# Patient Record
Sex: Female | Born: 1980
Health system: Southern US, Community
[De-identification: ages and names within clinical notes are randomized; demographics above are authoritative.]

## PROBLEM LIST (undated history)

## (undated) DIAGNOSIS — G8929 Other chronic pain: Secondary | ICD-10-CM

## (undated) DIAGNOSIS — F32A Depression, unspecified: Secondary | ICD-10-CM

## (undated) DIAGNOSIS — F419 Anxiety disorder, unspecified: Secondary | ICD-10-CM

## (undated) DIAGNOSIS — T7840XA Allergy, unspecified, initial encounter: Secondary | ICD-10-CM

## (undated) DIAGNOSIS — E7800 Pure hypercholesterolemia, unspecified: Secondary | ICD-10-CM

## (undated) HISTORY — DX: Anxiety disorder, unspecified: F41.9

## (undated) HISTORY — DX: Allergy, unspecified, initial encounter: T78.40XA

## (undated) HISTORY — DX: Pure hypercholesterolemia, unspecified: E78.00

## (undated) HISTORY — DX: Depression, unspecified: F32.A

## (undated) HISTORY — DX: Other chronic pain: G89.29

---

## 2019-08-21 ENCOUNTER — Encounter: Payer: Self-pay | Admitting: Psychiatry

## 2019-08-21 ENCOUNTER — Ambulatory Visit (INDEPENDENT_AMBULATORY_CARE_PROVIDER_SITE_OTHER): Payer: Medicaid Other | Admitting: Psychiatry

## 2019-08-21 ENCOUNTER — Other Ambulatory Visit: Payer: Self-pay

## 2019-08-21 DIAGNOSIS — F3341 Major depressive disorder, recurrent, in partial remission: Secondary | ICD-10-CM | POA: Insufficient documentation

## 2019-08-21 DIAGNOSIS — F411 Generalized anxiety disorder: Secondary | ICD-10-CM | POA: Diagnosis not present

## 2019-08-21 DIAGNOSIS — F4310 Post-traumatic stress disorder, unspecified: Secondary | ICD-10-CM | POA: Diagnosis not present

## 2019-08-21 MED ORDER — SERTRALINE HCL 100 MG PO TABS: 100.0000 mg | ORAL_TABLET | Freq: Every day | ORAL | 1 refills | Status: DC

## 2019-08-21 MED ORDER — TRAZODONE HCL 100 MG PO TABS: 100.0000 mg | ORAL_TABLET | Freq: Every day | ORAL | 1 refills | Status: DC
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
## 2019-10-11 ENCOUNTER — Ambulatory Visit: Payer: Medicaid Other | Admitting: Psychiatry

## 2019-10-14 ENCOUNTER — Other Ambulatory Visit: Payer: Self-pay

## 2019-10-14 ENCOUNTER — Encounter: Payer: Self-pay | Admitting: Psychiatry

## 2019-10-14 ENCOUNTER — Ambulatory Visit (INDEPENDENT_AMBULATORY_CARE_PROVIDER_SITE_OTHER): Payer: Medicaid Other | Admitting: Psychiatry

## 2019-10-14 DIAGNOSIS — F4310 Post-traumatic stress disorder, unspecified: Secondary | ICD-10-CM

## 2019-10-14 DIAGNOSIS — F411 Generalized anxiety disorder: Secondary | ICD-10-CM

## 2019-10-14 DIAGNOSIS — F3341 Major depressive disorder, recurrent, in partial remission: Secondary | ICD-10-CM | POA: Diagnosis not present

## 2019-10-14 MED ORDER — DULOXETINE HCL 30 MG PO CPEP: 30.0000 mg | ORAL_CAPSULE | Freq: Two times a day (BID) | ORAL | 1 refills | Status: DC

## 2019-10-14 MED ORDER — QUETIAPINE FUMARATE 50 MG PO TABS: 50.0000 mg | ORAL_TABLET | Freq: Every day | ORAL | 1 refills | Status: DC

## 2019-10-14 NOTE — Progress Notes (Signed)
BH MD OP Progress Note  I connected with  Regina Weber on 10/14/19 by a video enabled telemedicine application and verified that I am speaking with the correct person using two identifiers.   I discussed the limitations of evaluation and management by telemedicine. The patient expressed understanding and agreed to proceed.    10/14/2019 2:27 PM Regina Weber  MRN:  [id number]  Chief Complaint:  " I am feeling depressed and irritable."  HPI: Patient stated that Zoloft has not been helpful anymore.  She stated that with Zoloft she feels depressed and irritable.  She stated that she would like to try something else.  She asked about duloxetine as her fianc is taking that currently.  She informed that with the help of duloxetine her fianc is completely changed person.  She also reported that trazodone is not helping her sleep and she would like to try something else.  She was offered quetiapine as she also reported some irritability. She denied any suicidal ideations.  She stated that she is hoping to have a good holiday season and she wants to feel better with the help of medications.  Visit Diagnosis:    ICD-10-CM   1. MDD (major depressive disorder), recurrent, in partial remission (HCC)  F33.41   2. GAD (generalized anxiety disorder)  F41.1   3. PTSD (post-traumatic stress disorder)  F43.10     Past Psychiatric History: Pression, PTSD, anxiety  Past Medical History: No past medical history on file. No past surgical history on file.   Family History: No family history on file.  Social History:  Social History   Socioeconomic History  . Marital status: Single    Spouse name: Not on file  . Number of children: Not on file  . Years of education: Not on file  . Highest education level: Not on file  Occupational History  . Not on file  Tobacco Use  . Smoking status: Current Some Day Smoker  . Smokeless tobacco: Never Used  Substance and Sexual Activity  . Alcohol use: Not  Currently  . Drug use: Never  . Sexual activity: Not on file  Other Topics Concern  . Not on file  Social History Narrative  . Not on file   Social Determinants of Health   Financial Resource Strain:   . Difficulty of Paying Living Expenses: Not on file  Food Insecurity:   . Worried About Programme researcher, broadcasting/film/video in the Last Year: Not on file  . Ran Out of Food in the Last Year: Not on file  Transportation Needs:   . Lack of Transportation (Medical): Not on file  . Lack of Transportation (Non-Medical): Not on file  Physical Activity:   . Days of Exercise per Week: Not on file  . Minutes of Exercise per Session: Not on file  Stress:   . Feeling of Stress : Not on file  Social Connections:   . Frequency of Communication with Friends and Family: Not on file  . Frequency of Social Gatherings with Friends and Family: Not on file  . Attends Religious Services: Not on file  . Active Member of Clubs or Organizations: Not on file  . Attends Banker Meetings: Not on file  . Marital Status: Not on file    Allergies: Not on File  Metabolic Disorder Labs: No results found for: HGBA1C, MPG No results found for: PROLACTIN No results found for: CHOL, TRIG, HDL, CHOLHDL, VLDL, LDLCALC No results found for: TSH  Therapeutic Level  Labs: No results found for: LITHIUM No results found for: VALPROATE No components found for:  CBMZ  Current Medications: No current outpatient medications on file.   No current facility-administered medications for this visit.     Musculoskeletal: Strength & Muscle Tone: unable to assess due to telemed visit Gait & Station: unable to assess due to telemed visit Patient leans: unable to assess due to telemed visit  Psychiatric Specialty Exam: Review of Systems  There were no vitals taken for this visit.There is no height or weight on file to calculate BMI.  General Appearance: Fairly Groomed  Eye Contact:  Good  Speech:  Clear and Coherent  and Normal Rate  Volume:  Normal  Mood:  Euthymic  Affect:  Congruent  Thought Process:  Goal Directed, Linear and Descriptions of Associations: Intact  Orientation:  Full (Time, Place, and Person)  Thought Content: Logical   Suicidal Thoughts:  No  Homicidal Thoughts:  No  Memory:  Recent;   Good Remote;   Good  Judgement:  Fair  Insight:  Fair  Psychomotor Activity:  Normal  Concentration:  Concentration: Good and Attention Span: Good  Recall:  Good  Fund of Knowledge: Good  Language: Good  Akathisia:  Negative  Handed:  Right  AIMS (if indicated): not done  Assets:  Communication Skills Desire for Improvement Financial Resources/Insurance Housing Social Support  ADL's:  Intact  Cognition: WNL  Sleep:  Poor    Assessment and Plan: Patient reported that she is not doing well on Zoloft and trazodone combination and requested to try different combination of medications.  Duloxetine was offered based on her preference and Seroquel was added for sleep and mood symptoms. Potential side effects of medication and risks vs benefits of treatment vs non-treatment were explained and discussed. All questions were answered.  1. MDD (major depressive disorder), recurrent, in partial remission (HCC) - DULoxetine (CYMBALTA) 30 MG capsule; Take 1 capsule (30 mg total) by mouth 2 (two) times daily.  Dispense: 60 capsule; Refill: 1 - QUEtiapine (SEROQUEL) 50 MG tablet; Take 1 tablet (50 mg total) by mouth at bedtime.  Dispense: 30 tablet; Refill: 1  2. GAD (generalized anxiety disorder)  - DULoxetine (CYMBALTA) 30 MG capsule; Take 1 capsule (30 mg total) by mouth 2 (two) times daily.  Dispense: 60 capsule; Refill: 1 - QUEtiapine (SEROQUEL) 50 MG tablet; Take 1 tablet (50 mg total) by mouth at bedtime.  Dispense: 30 tablet; Refill: 1  3. PTSD (post-traumatic stress disorder)  - DULoxetine (CYMBALTA) 30 MG capsule; Take 1 capsule (30 mg total) by mouth 2 (two) times daily.  Dispense: 60  capsule; Refill: 1  Patient was explained to taper off Zoloft by taking half tablet for 1 week and then discontinuing.  Follow-up in 2 months.  [place] Crane, MD 10/14/2019, 2:27 PM

## 2019-12-13 ENCOUNTER — Ambulatory Visit (INDEPENDENT_AMBULATORY_CARE_PROVIDER_SITE_OTHER): Payer: Medicaid Other | Admitting: Psychiatry

## 2019-12-13 ENCOUNTER — Encounter: Payer: Self-pay | Admitting: Psychiatry

## 2019-12-13 ENCOUNTER — Other Ambulatory Visit: Payer: Self-pay

## 2019-12-13 DIAGNOSIS — F3342 Major depressive disorder, recurrent, in full remission: Secondary | ICD-10-CM | POA: Diagnosis not present

## 2019-12-13 DIAGNOSIS — F3341 Major depressive disorder, recurrent, in partial remission: Secondary | ICD-10-CM | POA: Diagnosis not present

## 2019-12-13 DIAGNOSIS — F411 Generalized anxiety disorder: Secondary | ICD-10-CM

## 2019-12-13 DIAGNOSIS — F4310 Post-traumatic stress disorder, unspecified: Secondary | ICD-10-CM | POA: Diagnosis not present

## 2019-12-13 MED ORDER — QUETIAPINE FUMARATE 50 MG PO TABS: 50.0000 mg | ORAL_TABLET | Freq: Every day | ORAL | 0 refills | Status: DC

## 2019-12-13 MED ORDER — DULOXETINE HCL 30 MG PO CPEP: 30.0000 mg | ORAL_CAPSULE | Freq: Two times a day (BID) | ORAL | 0 refills | Status: DC

## 2019-12-13 MED ORDER — BUSPIRONE HCL 10 MG PO TABS: 10.0000 mg | ORAL_TABLET | Freq: Two times a day (BID) | ORAL | 0 refills | Status: DC

## 2019-12-13 NOTE — Progress Notes (Signed)
BH MD OP Progress Note  Virtual Visit via Telephone Note  I connected with Regina Weber on 12/13/19 at 11:00 AM EST by telephone and verified that I am speaking with the correct person using two identifiers.    I discussed the limitations, risks, security and privacy concerns of performing an evaluation and management service by telephone and the availability of in person appointments. I also discussed with the patient that there may be a patient responsible charge related to this service. The patient expressed understanding and agreed to proceed.   12/13/2019 11:23 AM Regina Weber  MRN:  [id number]  Chief Complaint:  " My depression is better but I am still having hard time with my nerves."  HPI: Patient reported that she has noticed that duloxetine has helped her with her depression symptoms.  Her mood is stable now.  However she continues to have a lot of anxiety and feels nervous most of the time.  She stated that she is never taken anything specific for that.  She was asked if she would like to try buspirone to target her anxiety.  She agreed to give it a shot.  She also informed that she is sleeping much better after being switched to Seroquel from trazodone. She denied any specific triggers for her anxiety and stated that she would like to try the new medicine.   Visit Diagnosis:    ICD-10-CM   1. MDD (major depressive disorder), recurrent, in full remission (HCC)  F33.42   2. GAD (generalized anxiety disorder)  F41.1   3. PTSD (post-traumatic stress disorder)  F43.10     Past Psychiatric History: Pression, PTSD, anxiety  Past Medical History: History reviewed. No pertinent past medical history. History reviewed. No pertinent surgical history.   Family History: History reviewed. No pertinent family history.  Social History:  Social History   Socioeconomic History  . Marital status: Single    Spouse name: Not on file  . Number of children: Not on file  . Years of  education: Not on file  . Highest education level: Not on file  Occupational History  . Not on file  Tobacco Use  . Smoking status: Current Some Day Smoker  . Smokeless tobacco: Never Used  Substance and Sexual Activity  . Alcohol use: Not Currently  . Drug use: Never  . Sexual activity: Not on file  Other Topics Concern  . Not on file  Social History Narrative  . Not on file   Social Determinants of Health   Financial Resource Strain:   . Difficulty of Paying Living Expenses: Not on file  Food Insecurity:   . Worried About Programme researcher, broadcasting/film/video in the Last Year: Not on file  . Ran Out of Food in the Last Year: Not on file  Transportation Needs:   . Lack of Transportation (Medical): Not on file  . Lack of Transportation (Non-Medical): Not on file  Physical Activity:   . Days of Exercise per Week: Not on file  . Minutes of Exercise per Session: Not on file  Stress:   . Feeling of Stress : Not on file  Social Connections:   . Frequency of Communication with Friends and Family: Not on file  . Frequency of Social Gatherings with Friends and Family: Not on file  . Attends Religious Services: Not on file  . Active Member of Clubs or Organizations: Not on file  . Attends Banker Meetings: Not on file  . Marital Status: Not on  file    Allergies: Not on File  Metabolic Disorder Labs: No results found for: HGBA1C, MPG No results found for: PROLACTIN No results found for: CHOL, TRIG, HDL, CHOLHDL, VLDL, LDLCALC No results found for: TSH  Therapeutic Level Labs: No results found for: LITHIUM No results found for: VALPROATE No components found for:  CBMZ  Current Medications: Current Outpatient Medications  Medication Sig Dispense Refill  . DULoxetine (CYMBALTA) 30 MG capsule Take 1 capsule (30 mg total) by mouth 2 (two) times daily. 60 capsule 1  . QUEtiapine (SEROQUEL) 50 MG tablet Take 1 tablet (50 mg total) by mouth at bedtime. 30 tablet 1   No current  facility-administered medications for this visit.     Musculoskeletal: Strength & Muscle Tone: unable to assess due to telemed visit Gait & Station: unable to assess due to telemed visit Patient leans: unable to assess due to telemed visit  Psychiatric Specialty Exam: Review of Systems  There were no vitals taken for this visit.There is no height or weight on file to calculate BMI.  General Appearance: unable to assess due to phone visit  Eye Contact:  unable to assess due to phone visit  Speech:  Clear and Coherent and Normal Rate  Volume:  Normal  Mood:  Euthymic  Affect:  Congruent  Thought Process:  Goal Directed, Linear and Descriptions of Associations: Intact  Orientation:  Full (Time, Place, and Person)  Thought Content: Logical   Suicidal Thoughts:  No  Homicidal Thoughts:  No  Memory:  Recent;   Good Remote;   Good  Judgement:  Fair  Insight:  Fair  Psychomotor Activity:  Normal  Concentration:  Concentration: Good and Attention Span: Good  Recall:  Good  Fund of Knowledge: Good  Language: Good  Akathisia:  Negative  Handed:  Right  AIMS (if indicated): not done  Assets:  Communication Skills Desire for Improvement Financial Resources/Insurance Housing Social Support  ADL's:  Intact  Cognition: WNL  Sleep:  Improved with help of seroquel    Assessment and Plan: Patient reported that her symptoms of depression have improved however she is having a lot of anxiety.  She was offered buspirone to help her with the symptoms of anxiety. Potential side effects of medication and risks vs benefits of treatment vs non-treatment were explained and discussed. All questions were answered.  1. MDD (major depressive disorder), recurrent, in full remission ([place])  - DULoxetine (CYMBALTA) 30 MG capsule; Take 1 capsule (30 mg total) by mouth 2 (two) times daily.  Dispense: 180 capsule; Refill: 0 - QUEtiapine (SEROQUEL) 50 MG tablet; Take 1 tablet (50 mg total) by mouth at  bedtime.  Dispense: 90 tablet; Refill: 0   2. GAD (generalized anxiety disorder)  - DULoxetine (CYMBALTA) 30 MG capsule; Take 1 capsule (30 mg total) by mouth 2 (two) times daily.  Dispense: 180 capsule; Refill: 0 - QUEtiapine (SEROQUEL) 50 MG tablet; Take 1 tablet (50 mg total) by mouth at bedtime.  Dispense: 90 tablet; Refill: 0 - Start Buspirone 10 mg BID  3. PTSD (post-traumatic stress disorder)  - DULoxetine (CYMBALTA) 30 MG capsule; Take 1 capsule (30 mg total) by mouth 2 (two) times daily.  Dispense: 180 capsule; Refill: 0   Follow-up in 6 weeks.  [place] Crane, MD 12/13/2019, 11:23 AM

## 2020-01-20 ENCOUNTER — Other Ambulatory Visit: Payer: Self-pay

## 2020-01-20 ENCOUNTER — Ambulatory Visit (INDEPENDENT_AMBULATORY_CARE_PROVIDER_SITE_OTHER): Payer: Medicaid Other | Admitting: Psychiatry

## 2020-01-20 ENCOUNTER — Encounter: Payer: Self-pay | Admitting: Psychiatry

## 2020-01-20 DIAGNOSIS — F411 Generalized anxiety disorder: Secondary | ICD-10-CM | POA: Diagnosis not present

## 2020-01-20 DIAGNOSIS — F3341 Major depressive disorder, recurrent, in partial remission: Secondary | ICD-10-CM | POA: Diagnosis not present

## 2020-01-20 MED ORDER — DULOXETINE HCL 60 MG PO CPEP: 60.0000 mg | ORAL_CAPSULE | Freq: Two times a day (BID) | ORAL | 0 refills | Status: DC

## 2020-01-20 MED ORDER — CLONAZEPAM 0.5 MG PO TABS: 0.5000 mg | ORAL_TABLET | Freq: Two times a day (BID) | ORAL | 1 refills | Status: DC | PRN

## 2020-01-20 MED ORDER — QUETIAPINE FUMARATE 50 MG PO TABS: 50.0000 mg | ORAL_TABLET | Freq: Every day | ORAL | 0 refills | Status: DC

## 2020-01-20 NOTE — Progress Notes (Signed)
BH MD OP Progress Note  I connected with  Regina Weber on 01/20/20 by a video enabled telemedicine application and verified that I am speaking with the correct person using two identifiers.   I discussed the limitations of evaluation and management by telemedicine. The patient expressed understanding and agreed to proceed.    01/20/2020 1:47 PM Regina Weber  MRN:  [id number]  Chief Complaint:  " My nerves are shot."  HPI: Patient reported that she has been feeling very anxious and is unable to relax.  She stated that she has been very stressed as her sister is not allowing her to meet her niece and nephew due to an ongoing conflict between herself and her sister.  She stated that her sister has told her that she is not coming to her wedding.  Patient is planning to get married on May 14 to her fianc who she resides with.  Patient stated that she hopes that things between her and sister will get better.  She also reported that she recently fractured her ankle which has caused her to be really distressed.  She is not able to move around much which is very difficult for her to manage.  She asked if she could be given something better for anxiety like Klonopin or Xanax.  She stated that she took them in the past and they helped her. Patient was informed of their addictive potential and risk of abuse.  Patient stated that she never misused any of her medications and she does not have any history of addiction.  She stated that she can assure the writer that she will not misuse any of her prescribed medications.  She was agreeable to trying Klonopin 0.5 mg twice daily.  She also requested for increasing her dose of Cymbalta for optimal effect.  She stated she is not able to sleep well due to her recently fractured ankle however was sleeping fine for that.  She would like to continue her Seroquel at 50 mg dose.    Visit Diagnosis:    ICD-10-CM   1. MDD (major depressive disorder), recurrent, in partial  remission (HCC)  F33.41 DULoxetine (CYMBALTA) 60 MG capsule    QUEtiapine (SEROQUEL) 50 MG tablet  2. GAD (generalized anxiety disorder)  F41.1 DULoxetine (CYMBALTA) 60 MG capsule    clonazePAM (KLONOPIN) 0.5 MG tablet    Past Psychiatric History: Pression, PTSD, anxiety  Past Medical History: No past medical history on file. No past surgical history on file.   Family History: No family history on file.  Social History:  Social History   Socioeconomic History  . Marital status: Single    Spouse name: Not on file  . Number of children: Not on file  . Years of education: Not on file  . Highest education level: Not on file  Occupational History  . Not on file  Tobacco Use  . Smoking status: Current Some Day Smoker  . Smokeless tobacco: Never Used  Substance and Sexual Activity  . Alcohol use: Not Currently  . Drug use: Never  . Sexual activity: Not on file  Other Topics Concern  . Not on file  Social History Narrative  . Not on file   Social Determinants of Health   Financial Resource Strain:   . Difficulty of Paying Living Expenses:   Food Insecurity:   . Worried About Programme researcher, broadcasting/film/video in the Last Year:   . Barista in the Last Year:   Transportation Needs:   .  Lack of Transportation (Medical):   Marland Kitchen Lack of Transportation (Non-Medical):   Physical Activity:   . Days of Exercise per Week:   . Minutes of Exercise per Session:   Stress:   . Feeling of Stress :   Social Connections:   . Frequency of Communication with Friends and Family:   . Frequency of Social Gatherings with Friends and Family:   . Attends Religious Services:   . Active Member of Clubs or Organizations:   . Attends Banker Meetings:   Marland Kitchen Marital Status:     Allergies: Not on File  Metabolic Disorder Labs: No results found for: HGBA1C, MPG No results found for: PROLACTIN No results found for: CHOL, TRIG, HDL, CHOLHDL, VLDL, LDLCALC No results found for:  TSH  Therapeutic Level Labs: No results found for: LITHIUM No results found for: VALPROATE No components found for:  CBMZ  Current Medications: Current Outpatient Medications  Medication Sig Dispense Refill  . clonazePAM (KLONOPIN) 0.5 MG tablet Take 1 tablet (0.5 mg total) by mouth 2 (two) times daily as needed for anxiety. 60 tablet 1  . DULoxetine (CYMBALTA) 60 MG capsule Take 1 capsule (60 mg total) by mouth 2 (two) times daily. 180 capsule 0  . QUEtiapine (SEROQUEL) 50 MG tablet Take 1 tablet (50 mg total) by mouth at bedtime. 90 tablet 0   No current facility-administered medications for this visit.     Musculoskeletal: Strength & Muscle Tone: unable to assess due to telemed visit Gait & Station: unable to assess due to telemed visit Patient leans: unable to assess due to telemed visit  Psychiatric Specialty Exam: Review of Systems  There were no vitals taken for this visit.There is no height or weight on file to calculate BMI.  General Appearance: Fairly groomed  Eye Contact:  good  Speech:  Clear and Coherent and Normal Rate  Volume:  Normal  Mood:  Euthymic  Affect:  Congruent  Thought Process:  Goal Directed, Linear and Descriptions of Associations: Intact  Orientation:  Full (Time, Place, and Person)  Thought Content: Logical   Suicidal Thoughts:  No  Homicidal Thoughts:  No  Memory:  Recent;   Good Remote;   Good  Judgement:  Fair  Insight:  Fair  Psychomotor Activity:  Normal  Concentration:  Concentration: Good and Attention Span: Good  Recall:  Good  Fund of Knowledge: Good  Language: Good  Akathisia:  Negative  Handed:  Right  AIMS (if indicated): not done  Assets:  Communication Skills Desire for Improvement Financial Resources/Insurance Housing Social Support  ADL's:  Intact  Cognition: WNL  Sleep:  Fair    Assessment and Plan: Patient reported significant anxiety and stress due to some ongoing personal stressors.  She requested trial of  clonazepam for anxiety as BuSpar has not been helpful.  She also was agreeable to increasing dose of Cymbalta for optimal effect.  1. MDD (major depressive disorder), recurrent, in partial remission (HCC)  - Increase DULoxetine (CYMBALTA) 60 MG capsule; Take 1 capsule (60 mg total) by mouth 2 (two) times daily.  Dispense: 180 capsule; Refill: 0 - Continue QUEtiapine (SEROQUEL) 50 MG tablet; Take 1 tablet (50 mg total) by mouth at bedtime.  Dispense: 90 tablet; Refill: 0  2. GAD (generalized anxiety disorder)  - DULoxetine (CYMBALTA) 60 MG capsule; Take 1 capsule (60 mg total) by mouth 2 (two) times daily.  Dispense: 180 capsule; Refill: 0 - Start clonazePAM (KLONOPIN) 0.5 MG tablet; Take 1 tablet (0.5 mg  total) by mouth 2 (two) times daily as needed for anxiety.  Dispense: 60 tablet; Refill: 1  F/up in 2 months.  Nevada Crane, MD 01/20/2020, 1:47 PM

## 2020-01-22 ENCOUNTER — Ambulatory Visit: Payer: Medicaid Other | Admitting: Psychiatry

## 2020-03-17 ENCOUNTER — Other Ambulatory Visit: Payer: Self-pay

## 2020-03-17 ENCOUNTER — Encounter: Payer: Self-pay | Admitting: Psychiatry

## 2020-03-17 ENCOUNTER — Telehealth (INDEPENDENT_AMBULATORY_CARE_PROVIDER_SITE_OTHER): Payer: Medicaid Other | Admitting: Psychiatry

## 2020-03-17 DIAGNOSIS — F411 Generalized anxiety disorder: Secondary | ICD-10-CM

## 2020-03-17 DIAGNOSIS — F4310 Post-traumatic stress disorder, unspecified: Secondary | ICD-10-CM

## 2020-03-17 DIAGNOSIS — F3341 Major depressive disorder, recurrent, in partial remission: Secondary | ICD-10-CM | POA: Diagnosis not present

## 2020-03-17 MED ORDER — QUETIAPINE FUMARATE 50 MG PO TABS: 100.0000 mg | ORAL_TABLET | Freq: Every day | ORAL | 0 refills | Status: DC

## 2020-03-17 MED ORDER — CLONAZEPAM 1 MG PO TABS: 1.0000 mg | ORAL_TABLET | Freq: Two times a day (BID) | ORAL | 1 refills | Status: DC

## 2020-03-17 MED ORDER — DULOXETINE HCL 60 MG PO CPEP: 60.0000 mg | ORAL_CAPSULE | Freq: Two times a day (BID) | ORAL | 0 refills | Status: DC

## 2020-03-17 NOTE — Addendum Note (Signed)
Addended by: Zena Amos on: 03/17/2020 03:51 PM   Modules accepted: Orders

## 2020-03-17 NOTE — Progress Notes (Addendum)
BH MD OP Progress Note  Virtual Visit via Video Note  I connected with Regina Weber on 03/17/20 at  2:00 PM EDT by a video enabled telemedicine application and verified that I am speaking with the correct person using two identifiers.  Location: Patient: Home Provider: Clinic   I discussed the limitations of evaluation and management by telemedicine and the availability of in person appointments. The patient expressed understanding and agreed to proceed.  I provided 15 minutes of non-face-to-face time during this encounter.     03/17/2020 3:19 PM Regina Weber  MRN:  [id number]  Chief Complaint:  "I'm not good. My nerves are shot."  HPI: Patient reported that she has been feeling anxious.  She notes that she and her fiance are now living in hotels because they are unable to find a place to park their camper. She reports that she and her fiance postponed their wedding until August. She notes that her parents are also a source of her stress. She reports that Klonopin has not been effective. She reports that her nerves are shot and asked for an increase in the dose. Patient informed that her Klonopin dose would be increased from 0.5 mg BID to 1 mg BID. She endorsed understanding and was agreeable to adjustment. She endorse poor sleep and request an adjustment of Seroquel. Patient informed that Seroquel would be increased to 100 mg daily. She endorsed understanding and was agreeable to changes. No other concerns noted at this time.      Visit Diagnosis:    ICD-10-CM   1. MDD (major depressive disorder), recurrent, in partial remission (HCC)  F33.41   2. GAD (generalized anxiety disorder)  F41.1   3. PTSD (post-traumatic stress disorder)  F43.10     Past Psychiatric History: Pression, PTSD, anxiety  Past Medical History: No past medical history on file. No past surgical history on file.   Family History: No family history on file.  Social History:  Social History   Socioeconomic  History  . Marital status: Single    Spouse name: Not on file  . Number of children: Not on file  . Years of education: Not on file  . Highest education level: Not on file  Occupational History  . Not on file  Tobacco Use  . Smoking status: Current Some Day Smoker  . Smokeless tobacco: Never Used  Substance and Sexual Activity  . Alcohol use: Not Currently  . Drug use: Never  . Sexual activity: Not on file  Other Topics Concern  . Not on file  Social History Narrative  . Not on file   Social Determinants of Health   Financial Resource Strain:   . Difficulty of Paying Living Expenses:   Food Insecurity:   . Worried About Programme researcher, broadcasting/film/video in the Last Year:   . Barista in the Last Year:   Transportation Needs:   . Freight forwarder (Medical):   Marland Kitchen Lack of Transportation (Non-Medical):   Physical Activity:   . Days of Exercise per Week:   . Minutes of Exercise per Session:   Stress:   . Feeling of Stress :   Social Connections:   . Frequency of Communication with Friends and Family:   . Frequency of Social Gatherings with Friends and Family:   . Attends Religious Services:   . Active Member of Clubs or Organizations:   . Attends Banker Meetings:   Marland Kitchen Marital Status:     Allergies: Not  on File  Metabolic Disorder Labs: No results found for: HGBA1C, MPG No results found for: PROLACTIN No results found for: CHOL, TRIG, HDL, CHOLHDL, VLDL, LDLCALC No results found for: TSH  Therapeutic Level Labs: No results found for: LITHIUM No results found for: VALPROATE No components found for:  CBMZ  Current Medications: Current Outpatient Medications  Medication Sig Dispense Refill  . DULoxetine (CYMBALTA) 60 MG capsule Take 1 capsule (60 mg total) by mouth 2 (two) times daily. 180 capsule 0  . QUEtiapine (SEROQUEL) 50 MG tablet Take 1 tablet (50 mg total) by mouth at bedtime. 90 tablet 0   No current facility-administered medications for this  visit.     Musculoskeletal: Strength & Muscle Tone: unable to assess due to telemed visit Gait & Station: unable to assess due to telemed visit Patient leans: unable to assess due to telemed visit  Psychiatric Specialty Exam: Review of Systems  There were no vitals taken for this visit.There is no height or weight on file to calculate BMI.  General Appearance: Fairly groomed  Eye Contact:  good  Speech:  Clear and Coherent and Normal Rate  Volume:  Normal  Mood:  Euthymic  Affect:  Congruent  Thought Process:  Goal Directed, Linear and Descriptions of Associations: Intact  Orientation:  Full (Time, Place, and Person)  Thought Content: Logical   Suicidal Thoughts:  No  Homicidal Thoughts:  No  Memory:  Recent;   Good Remote;   Good  Judgement:  Fair  Insight:  Fair  Psychomotor Activity:  Normal  Concentration:  Concentration: Good and Attention Span: Good  Recall:  Good  Fund of Knowledge: Good  Language: Good  Akathisia:  Negative  Handed:  Right  AIMS (if indicated): not done  Assets:  Communication Skills Desire for Improvement Financial Resources/Insurance Housing Social Support  ADL's:  Intact  Cognition: WNL  Sleep:  Fair    Assessment and Plan: Patient reports that Klonopin 0.5mg  BID has not been effective in managing her anxiety. She is agreeable to an increase in the dose to 1 mg BID help manage her symptoms. She is also agreeable to increase Seroquel 50 mg HS to 100 mg.  1. MDD (major depressive disorder), recurrent, in partial remission (HCC)  - DULoxetine (CYMBALTA) 60 MG capsule; Take 1 capsule (60 mg total) by mouth 2 (two) times daily.  Dispense: 180 capsule; Refill: 0 - QUEtiapine (SEROQUEL) 50 MG tablet; Take 2 tablets (100 mg total) by mouth at bedtime.  Dispense: 90 tablet; Refill: 0  2. GAD (generalized anxiety disorder)  - Increase Clonazepam 1 mg BID - DULoxetine (CYMBALTA) 60 MG capsule; Take 1 capsule (60 mg total) by mouth 2 (two) times  daily.  Dispense: 180 capsule; Refill: 0  3. PTSD (post-traumatic stress disorder)   F/up in 2 months.  Salley Slaughter, NP 03/17/2020, 3:19 PM   I saw and managed the pt with NP B. Ronne Binning.  [place] Crane, MD 03/17/2020 3:50 PM

## 2020-04-01 ENCOUNTER — Ambulatory Visit: Payer: Medicaid Other | Attending: Sports Medicine | Admitting: Physical Therapy

## 2020-04-08 ENCOUNTER — Ambulatory Visit: Payer: Medicaid Other | Admitting: Physical Therapy

## 2020-04-13 ENCOUNTER — Encounter: Payer: Medicaid Other | Admitting: Physical Therapy

## 2020-04-15 ENCOUNTER — Encounter: Payer: Medicaid Other | Admitting: Physical Therapy

## 2020-04-20 ENCOUNTER — Encounter: Payer: Medicaid Other | Admitting: Physical Therapy

## 2020-04-22 ENCOUNTER — Encounter: Payer: Medicaid Other | Admitting: Physical Therapy

## 2020-04-27 ENCOUNTER — Encounter: Payer: Medicaid Other | Admitting: Physical Therapy

## 2020-04-27 ENCOUNTER — Ambulatory Visit: Payer: Medicaid Other | Admitting: Occupational Therapy

## 2020-04-29 ENCOUNTER — Encounter: Payer: Medicaid Other | Admitting: Physical Therapy

## 2020-05-05 ENCOUNTER — Encounter: Payer: Medicaid Other | Admitting: Physical Therapy

## 2020-05-07 ENCOUNTER — Encounter: Payer: Medicaid Other | Admitting: Physical Therapy

## 2020-05-12 ENCOUNTER — Encounter: Payer: Medicaid Other | Admitting: Physical Therapy

## 2020-05-13 ENCOUNTER — Telehealth (INDEPENDENT_AMBULATORY_CARE_PROVIDER_SITE_OTHER): Payer: Medicaid Other | Admitting: Psychiatry

## 2020-05-13 ENCOUNTER — Other Ambulatory Visit: Payer: Self-pay

## 2020-05-13 ENCOUNTER — Telehealth: Payer: Medicaid Other | Admitting: Psychiatry

## 2020-05-13 ENCOUNTER — Encounter (HOSPITAL_COMMUNITY): Payer: Self-pay | Admitting: Psychiatry

## 2020-05-13 DIAGNOSIS — F3341 Major depressive disorder, recurrent, in partial remission: Secondary | ICD-10-CM | POA: Diagnosis not present

## 2020-05-13 DIAGNOSIS — F411 Generalized anxiety disorder: Secondary | ICD-10-CM | POA: Diagnosis not present

## 2020-05-13 MED ORDER — CLONAZEPAM 1 MG PO TABS: 1.0000 mg | ORAL_TABLET | Freq: Two times a day (BID) | ORAL | 2 refills | Status: DC | PRN

## 2020-05-13 MED ORDER — QUETIAPINE FUMARATE 50 MG PO TABS: 100.0000 mg | ORAL_TABLET | Freq: Every day | ORAL | 0 refills | Status: DC

## 2020-05-13 MED ORDER — DULOXETINE HCL 60 MG PO CPEP: 60.0000 mg | ORAL_CAPSULE | Freq: Two times a day (BID) | ORAL | 0 refills | Status: DC

## 2020-05-13 NOTE — Progress Notes (Signed)
BH MD OP Progress Note  Virtual Visit via Video Note  I connected with [name] on 05/13/20 at  3:00 PM EDT by a video enabled telemedicine application and verified that I am speaking with the correct person using two identifiers.  Location: Patient: Home Provider: Clinic   I discussed the limitations of evaluation and management by telemedicine and the availability of in person appointments. The patient expressed understanding and agreed to proceed.  I provided 15 minutes of non-face-to-face time during this encounter.    05/13/2020 3:05 PM Sparkle Aube  MRN:  [id number]  Chief Complaint:  " Things are going pretty good."  HPI: Patient reported that she is doing well.  She informed that she found the increase in Cymbalta dose and addition of clonazepam to her regimen to be very helpful.  She stated that her anxiety is better however she still has some days when she feels anxious.  She informed that last night she felt very anxious when she went to bed and it took her a while to calm down.   She informed that she and her fianc recently moved to a brand-new apartment no one has ever lived in and they are quite happy and excited about that.  She informed that they are hoping to get married next year and so far things are going well.   Visit Diagnosis:    ICD-10-CM   1. MDD (major depressive disorder), recurrent, in partial remission (HCC)  F33.41   2. GAD (generalized anxiety disorder)  F41.1     Past Psychiatric History: Pression, PTSD, anxiety  Past Medical History: No past medical history on file. No past surgical history on file.   Family History: No family history on file.  Social History:  Social History   Socioeconomic History  . Marital status: Single    Spouse name: Not on file  . Number of children: Not on file  . Years of education: Not on file  . Highest education level: Not on file  Occupational History  . Not on file  Tobacco Use  . Smoking status:  Current Some Day Smoker  . Smokeless tobacco: Never Used  Substance and Sexual Activity  . Alcohol use: Not Currently  . Drug use: Never  . Sexual activity: Not on file  Other Topics Concern  . Not on file  Social History Narrative  . Not on file   Social Determinants of Health   Financial Resource Strain:   . Difficulty of Paying Living Expenses:   Food Insecurity:   . Worried About Programme researcher, broadcasting/film/video in the Last Year:   . Barista in the Last Year:   Transportation Needs:   . Freight forwarder (Medical):   Marland Kitchen Lack of Transportation (Non-Medical):   Physical Activity:   . Days of Exercise per Week:   . Minutes of Exercise per Session:   Stress:   . Feeling of Stress :   Social Connections:   . Frequency of Communication with Friends and Family:   . Frequency of Social Gatherings with Friends and Family:   . Attends Religious Services:   . Active Member of Clubs or Organizations:   . Attends Banker Meetings:   Marland Kitchen Marital Status:     Allergies: Not on File  Metabolic Disorder Labs: No results found for: HGBA1C, MPG No results found for: PROLACTIN No results found for: CHOL, TRIG, HDL, CHOLHDL, VLDL, LDLCALC No results found for: TSH  Therapeutic Level  Labs: No results found for: LITHIUM No results found for: VALPROATE No components found for:  CBMZ  Current Medications: Current Outpatient Medications  Medication Sig Dispense Refill  . clonazePAM (KLONOPIN) 1 MG tablet Take 1 tablet (1 mg total) by mouth 2 (two) times daily. 60 tablet 1  . DULoxetine (CYMBALTA) 60 MG capsule Take 1 capsule (60 mg total) by mouth 2 (two) times daily. 180 capsule 0  . QUEtiapine (SEROQUEL) 50 MG tablet Take 2 tablets (100 mg total) by mouth at bedtime. 90 tablet 0   No current facility-administered medications for this visit.     Musculoskeletal: Strength & Muscle Tone: unable to assess due to telemed visit Gait & Station: unable to assess due to  telemed visit Patient leans: unable to assess due to telemed visit  Psychiatric Specialty Exam: Review of Systems  There were no vitals taken for this visit.There is no height or weight on file to calculate BMI.  General Appearance: Fairly groomed  Eye Contact:  good  Speech:  Clear and Coherent and Normal Rate  Volume:  Normal  Mood:  Euthymic  Affect:  Congruent  Thought Process:  Goal Directed, Linear and Descriptions of Associations: Intact  Orientation:  Full (Time, Place, and Person)  Thought Content: Logical   Suicidal Thoughts:  No  Homicidal Thoughts:  No  Memory:  Recent;   Good Remote;   Good  Judgement:  Fair  Insight:  Fair  Psychomotor Activity:  Normal  Concentration:  Concentration: Good and Attention Span: Good  Recall:  Good  Fund of Knowledge: Good  Language: Good  Akathisia:  Negative  Handed:  Right  AIMS (if indicated): not done  Assets:  Communication Skills Desire for Improvement Financial Resources/Insurance Housing Social Support  ADL's:  Intact  Cognition: WNL  Sleep:  Fair    Assessment and Plan: Patient appears to be doing better, reported intermittent anxiety.  She requested for increasing her dose of clonazepam, writer recommended that she continues taking clonazepam 0.5 twice daily and that a few extra tablets will be sent for as needed use if needed.  1. MDD (major depressive disorder), recurrent, in partial remission (HCC)  - DULoxetine (CYMBALTA) 60 MG capsule; Take 1 capsule (60 mg total) by mouth 2 (two) times daily.  Dispense: 180 capsule; Refill: 0 - QUEtiapine (SEROQUEL) 50 MG tablet; Take 2 tablets (100 mg total) by mouth at bedtime.  Dispense: 90 tablet; Refill: 0  2. GAD (generalized anxiety disorder)  - DULoxetine (CYMBALTA) 60 MG capsule; Take 1 capsule (60 mg total) by mouth 2 (two) times daily.  Dispense: 180 capsule; Refill: 0 - clonazePAM (KLONOPIN) 1 MG tablet; Take 1 tablet (1 mg total) by mouth 2 (two) times daily as  needed for anxiety.  Dispense: 70 tablet; Refill: 2   Continue same regimen. F/up in 3 months.  Zena Amos, MD 05/13/2020, 3:05 PM

## 2020-05-14 ENCOUNTER — Encounter: Payer: Medicaid Other | Admitting: Physical Therapy

## 2020-05-19 ENCOUNTER — Encounter: Payer: Medicaid Other | Admitting: Physical Therapy

## 2020-05-21 ENCOUNTER — Encounter: Payer: Medicaid Other | Admitting: Physical Therapy

## 2020-05-26 ENCOUNTER — Encounter: Payer: Medicaid Other | Admitting: Physical Therapy

## 2020-05-28 ENCOUNTER — Encounter: Payer: Medicaid Other | Admitting: Physical Therapy

## 2020-07-20 ENCOUNTER — Telehealth (HOSPITAL_COMMUNITY): Payer: Self-pay | Admitting: *Deleted

## 2020-07-20 NOTE — Telephone Encounter (Signed)
[facility]  Has Approved QUEtiapine (SEROQUEL) 50 MG tablet  Quantity: 180  Duration: 90 days   Effective:   Approved  12 months 07/20/2020  To  07/20/2021

## 2020-08-11 ENCOUNTER — Telehealth (INDEPENDENT_AMBULATORY_CARE_PROVIDER_SITE_OTHER): Payer: Medicaid Other | Admitting: Psychiatry

## 2020-08-11 ENCOUNTER — Other Ambulatory Visit: Payer: Self-pay

## 2020-08-11 ENCOUNTER — Encounter (HOSPITAL_COMMUNITY): Payer: Self-pay | Admitting: Psychiatry

## 2020-08-11 DIAGNOSIS — F3341 Major depressive disorder, recurrent, in partial remission: Secondary | ICD-10-CM | POA: Diagnosis not present

## 2020-08-11 DIAGNOSIS — F4310 Post-traumatic stress disorder, unspecified: Secondary | ICD-10-CM | POA: Diagnosis not present

## 2020-08-11 DIAGNOSIS — F411 Generalized anxiety disorder: Secondary | ICD-10-CM

## 2020-08-11 MED ORDER — CLONAZEPAM 1 MG PO TABS: 1.0000 mg | ORAL_TABLET | Freq: Two times a day (BID) | ORAL | 1 refills | Status: DC | PRN

## 2020-08-11 MED ORDER — VENLAFAXINE HCL ER 150 MG PO CP24: 150.0000 mg | ORAL_CAPSULE | Freq: Every day | ORAL | 0 refills | Status: DC

## 2020-08-11 MED ORDER — QUETIAPINE FUMARATE 50 MG PO TABS: 100.0000 mg | ORAL_TABLET | Freq: Every day | ORAL | 0 refills | Status: DC
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
## 2020-09-10 ENCOUNTER — Emergency Department
Admission: EM | Admit: 2020-09-10 | Discharge: 2020-09-10 | Discharge status: Against Medical Advice | Payer: Medicaid Other

## 2020-10-09 ENCOUNTER — Telehealth (HOSPITAL_COMMUNITY): Payer: Medicaid Other | Admitting: Psychiatry

## 2020-11-09 ENCOUNTER — Other Ambulatory Visit: Payer: Self-pay

## 2020-11-09 ENCOUNTER — Telehealth (INDEPENDENT_AMBULATORY_CARE_PROVIDER_SITE_OTHER): Payer: Medicaid Other | Admitting: Psychiatry

## 2020-11-09 ENCOUNTER — Encounter (HOSPITAL_COMMUNITY): Payer: Self-pay | Admitting: Psychiatry

## 2020-11-09 DIAGNOSIS — F3341 Major depressive disorder, recurrent, in partial remission: Secondary | ICD-10-CM

## 2020-11-09 DIAGNOSIS — F330 Major depressive disorder, recurrent, mild: Secondary | ICD-10-CM

## 2020-11-09 DIAGNOSIS — F411 Generalized anxiety disorder: Secondary | ICD-10-CM

## 2020-11-09 DIAGNOSIS — F4310 Post-traumatic stress disorder, unspecified: Secondary | ICD-10-CM

## 2020-11-09 MED ORDER — QUETIAPINE FUMARATE 50 MG PO TABS: 100.0000 mg | ORAL_TABLET | Freq: Every day | ORAL | 0 refills | Status: DC

## 2020-11-09 MED ORDER — CLONAZEPAM 1 MG PO TABS: 1.0000 mg | ORAL_TABLET | Freq: Two times a day (BID) | ORAL | 1 refills | Status: DC | PRN

## 2020-11-09 MED ORDER — VILAZODONE HCL 40 MG PO TABS: 40.0000 mg | ORAL_TABLET | Freq: Every day | ORAL | 1 refills | Status: DC

## 2020-11-09 NOTE — Progress Notes (Signed)
BH MD OP Progress Note  Virtual Visit via Video Note  I connected with Regina Weber on 11/09/20 at  1:40 PM EST by a video enabled telemedicine application and verified that I am speaking with the correct person using two identifiers.  Location: Patient: Home Provider: Clinic   I discussed the limitations of evaluation and management by telemedicine and the availability of in person appointments. The patient expressed understanding and agreed to proceed.  I provided 17 minutes of non-face-to-face time during this encounter.    11/09/2020 1:43 PM Regina Weber  MRN:  [id number]  Chief Complaint:  " I have been feeling quite depressed lately."  HPI: Patient reported that she has been feeling quite depressed over the last few weeks.  She stated that she feels sad all the times and does not feel like she has any energy to do anything.  She tries to keep her self busy but sometimes feels very overwhelmed.  She also reported anhedonia.  She has crying spells.  She denied any suicidal ideations.  Her appetite and sleep are fairly okay with the help of Seroquel. Writer reviewed her past medications with her and she stated that nothing has really helped her significantly except for Cymbalta but stopped being effective after a few months. She was just Effexor in October by the Clinical research associate.  Patient stated that she can really tell a big difference after being switched from Cymbalta to Effexor. She also complained of having a lot of anxiety and stated that she does not think clonazepam is helping as much as it was.  She was agreeable to trial of Viibryd.  She was instructed to taper off the Effexor and then discontinue it followed by switching to Viibryd.  She was instructed to  Past medications tried: Sertraline-did not help much, Prozac-felt it made her symptoms worse, duloxetine did help initially but stopped being effective after some time, Effexor did not help much  Visit Diagnosis:    ICD-10-CM    1. MDD (major depressive disorder), recurrent episode, mild (HCC)  F33.0   2. GAD (generalized anxiety disorder)  F41.1   3. PTSD (post-traumatic stress disorder)  F43.10     Past Psychiatric History: Depression, PTSD, anxiety  Past Medical History: History reviewed. No pertinent past medical history. History reviewed. No pertinent surgical history.   Family History: History reviewed. No pertinent family history.  Social History:  Social History   Socioeconomic History  . Marital status: Single    Spouse name: Not on file  . Number of children: Not on file  . Years of education: Not on file  . Highest education level: Not on file  Occupational History  . Not on file  Tobacco Use  . Smoking status: Current Some Day Smoker  . Smokeless tobacco: Never Used  Substance and Sexual Activity  . Alcohol use: Not Currently  . Drug use: Never  . Sexual activity: Not on file  Other Topics Concern  . Not on file  Social History Narrative  . Not on file   Social Determinants of Health   Financial Resource Strain: Not on file  Food Insecurity: Not on file  Transportation Needs: Not on file  Physical Activity: Not on file  Stress: Not on file  Social Connections: Not on file    Allergies: Not on File  Metabolic Disorder Labs: No results found for: HGBA1C, MPG No results found for: PROLACTIN No results found for: CHOL, TRIG, HDL, CHOLHDL, VLDL, LDLCALC No results found for: TSH  Therapeutic Level Labs: No results found for: LITHIUM No results found for: VALPROATE No components found for:  CBMZ  Current Medications: Current Outpatient Medications  Medication Sig Dispense Refill  . clonazePAM (KLONOPIN) 1 MG tablet Take 1 tablet (1 mg total) by mouth 2 (two) times daily as needed for anxiety. 60 tablet 1  . QUEtiapine (SEROQUEL) 50 MG tablet Take 2 tablets (100 mg total) by mouth at bedtime. 90 tablet 0   No current facility-administered medications for this visit.      Musculoskeletal: Strength & Muscle Tone: unable to assess due to telemed visit Gait & Station: unable to assess due to telemed visit Patient leans: unable to assess due to telemed visit  Psychiatric Specialty Exam: Review of Systems  There were no vitals taken for this visit.There is no height or weight on file to calculate BMI.  General Appearance: Fairly groomed  Eye Contact:  good  Speech:  Clear and Coherent and Normal Rate  Volume:  Normal  Mood:  Depressed  Affect:  Congruent  Thought Process:  Goal Directed, Linear and Descriptions of Associations: Intact  Orientation:  Full (Time, Place, and Person)  Thought Content: Logical   Suicidal Thoughts:  No  Homicidal Thoughts:  No  Memory:  Recent;   Good Remote;   Good  Judgement:  Fair  Insight:  Fair  Psychomotor Activity:  Normal  Concentration:  Concentration: Good and Attention Span: Good  Recall:  Good  Fund of Knowledge: Good  Language: Good  Akathisia:  Negative  Handed:  Right  AIMS (if indicated): not done  Assets:  Communication Skills Desire for Improvement Financial Resources/Insurance Housing Social Support  ADL's:  Intact  Cognition: WNL  Sleep:  Fair    Assessment and Plan: Patient reported feeling depressed and did not find Effexor to be particularly helpful.  She has tried 2 SSRIs and 2 SNRIs in the past.  She was agreeable to trial of Viibryd to target her depressive symptoms. Potential side effects of medication and risks vs benefits of treatment vs non-treatment were explained and discussed. All questions were answered.   1. MDD (major depressive disorder), recurrent episode, mild (HCC)  - Taper off Effexor XR in 2 weeks, then stop and then start Viibryd. - Start Vilazodone HCl (VIIBRYD) 40 MG TABS; Take 1 tablet (40 mg total) by mouth daily with breakfast.  Dispense: 30 tablet; Refill: 1 - QUEtiapine (SEROQUEL) 50 MG tablet; Take 2 tablets (100 mg total) by mouth at bedtime.  Dispense:  90 tablet; Refill: 0  2. GAD (generalized anxiety disorder)  - Vilazodone HCl (VIIBRYD) 40 MG TABS; Take 1 tablet (40 mg total) by mouth daily with breakfast.  Dispense: 30 tablet; Refill: 1 - clonazePAM (KLONOPIN) 1 MG tablet; Take 1 tablet (1 mg total) by mouth 2 (two) times daily as needed for anxiety.  Dispense: 60 tablet; Refill: 1  3. PTSD (post-traumatic stress disorder)  - Vilazodone HCl (VIIBRYD) 40 MG TABS; Take 1 tablet (40 mg total) by mouth daily with breakfast.  Dispense: 30 tablet; Refill: 1  F/up in 6 weeks.  Zena Amos, MD 11/09/2020, 1:43 PM

## 2020-12-08 ENCOUNTER — Other Ambulatory Visit (HOSPITAL_COMMUNITY): Payer: Self-pay | Admitting: Psychiatry

## 2020-12-08 DIAGNOSIS — F4310 Post-traumatic stress disorder, unspecified: Secondary | ICD-10-CM

## 2020-12-08 DIAGNOSIS — F411 Generalized anxiety disorder: Secondary | ICD-10-CM

## 2020-12-08 DIAGNOSIS — F3341 Major depressive disorder, recurrent, in partial remission: Secondary | ICD-10-CM

## 2020-12-23 ENCOUNTER — Other Ambulatory Visit: Payer: Self-pay

## 2020-12-23 ENCOUNTER — Telehealth (INDEPENDENT_AMBULATORY_CARE_PROVIDER_SITE_OTHER): Payer: Medicaid Other | Admitting: Psychiatry

## 2020-12-23 ENCOUNTER — Encounter (HOSPITAL_COMMUNITY): Payer: Self-pay | Admitting: Psychiatry

## 2020-12-23 DIAGNOSIS — F4310 Post-traumatic stress disorder, unspecified: Secondary | ICD-10-CM | POA: Diagnosis not present

## 2020-12-23 DIAGNOSIS — F330 Major depressive disorder, recurrent, mild: Secondary | ICD-10-CM | POA: Diagnosis not present

## 2020-12-23 DIAGNOSIS — F411 Generalized anxiety disorder: Secondary | ICD-10-CM

## 2020-12-23 MED ORDER — QUETIAPINE FUMARATE 50 MG PO TABS: 100.0000 mg | ORAL_TABLET | Freq: Every day | ORAL | 0 refills | Status: DC

## 2020-12-23 MED ORDER — VILAZODONE HCL 40 MG PO TABS: 40.0000 mg | ORAL_TABLET | Freq: Every day | ORAL | 1 refills | Status: DC

## 2020-12-23 MED ORDER — CLONAZEPAM 1 MG PO TABS: 1.0000 mg | ORAL_TABLET | Freq: Two times a day (BID) | ORAL | 1 refills | Status: DC | PRN

## 2020-12-23 NOTE — Progress Notes (Signed)
BH MD OP Progress Note  Virtual Visit via Video Note  I connected with Regina Weber on 12/23/20 at  2:40 PM EST by a video enabled telemedicine application and verified that I am speaking with the correct person using two identifiers.  Location: Patient: Home Provider: Clinic   I discussed the limitations of evaluation and management by telemedicine and the availability of in person appointments. The patient expressed understanding and agreed to proceed.  I provided 17 minutes of non-face-to-face time during this encounter.      12/23/2020 2:52 PM Regina Weber  MRN:  [id number]  Chief Complaint:  "I am still depressed, 14 years ago my grandmother passed away this month."  HPI: Patient informed that she still depressed because this past weekend was her grandmother's 14th death anniversary.  She stated that she really misses her grandmother because she was very close to her.  She stated that she still taking the Effexor because the pharmacy informed her that her prescription for Viibryd needs prior authorization. She stated that after the anniversary is over she is feeling a little bit of relief and she is hoping that she will feel better. She still wants to try Viibryd.  Writer informed her that the office will contact the pharmacy and make sure that prior authorization for Viibryd is taking care for her.  She stated that she is looking forward to her wedding which is scheduled for me.  She already has become and everything else very and she is going to try it again next week. She stated that she is also excited because they have started him for a house to rent and they will be able to get out of his apartment. In the end she stated that she does not feel the clonazepam is helping.  She was agreeable with the writer recommended that we try it with Viibryd and see how she does.   Past medications tried: Sertraline-did not help much, Prozac-felt it made her symptoms worse, Duloxetine  did help initially but stopped being effective after some time, Effexor did not help much  Visit Diagnosis:    ICD-10-CM   1. MDD (major depressive disorder), recurrent episode, mild (HCC)  F33.0 QUEtiapine (SEROQUEL) 50 MG tablet    Vilazodone HCl (VIIBRYD) 40 MG TABS  2. GAD (generalized anxiety disorder)  F41.1 clonazePAM (KLONOPIN) 1 MG tablet    Vilazodone HCl (VIIBRYD) 40 MG TABS  3. PTSD (post-traumatic stress disorder)  F43.10 Vilazodone HCl (VIIBRYD) 40 MG TABS    Past Psychiatric History: Depression, PTSD, anxiety  Past Medical History: No past medical history on file. No past surgical history on file.   Family History: No family history on file.  Social History:  Social History   Socioeconomic History  . Marital status: Single    Spouse name: Not on file  . Number of children: Not on file  . Years of education: Not on file  . Highest education level: Not on file  Occupational History  . Not on file  Tobacco Use  . Smoking status: Current Some Day Smoker  . Smokeless tobacco: Never Used  Substance and Sexual Activity  . Alcohol use: Not Currently  . Drug use: Never  . Sexual activity: Not on file  Other Topics Concern  . Not on file  Social History Narrative  . Not on file   Social Determinants of Health   Financial Resource Strain: Not on file  Food Insecurity: Not on file  Transportation Needs: Not on file  Physical Activity: Not on file  Stress: Not on file  Social Connections: Not on file    Allergies: Not on File  Metabolic Disorder Labs: No results found for: HGBA1C, MPG No results found for: PROLACTIN No results found for: CHOL, TRIG, HDL, CHOLHDL, VLDL, LDLCALC No results found for: TSH  Therapeutic Level Labs: No results found for: LITHIUM No results found for: VALPROATE No components found for:  CBMZ  Current Medications: Current Outpatient Medications  Medication Sig Dispense Refill  . clonazePAM (KLONOPIN) 1 MG tablet Take 1  tablet (1 mg total) by mouth 2 (two) times daily as needed for anxiety. 60 tablet 1  . QUEtiapine (SEROQUEL) 50 MG tablet Take 2 tablets (100 mg total) by mouth at bedtime. 90 tablet 0  . Vilazodone HCl (VIIBRYD) 40 MG TABS Take 1 tablet (40 mg total) by mouth daily with breakfast. 30 tablet 1   No current facility-administered medications for this visit.     Musculoskeletal: Strength & Muscle Tone: unable to assess due to telemed visit Gait & Station: unable to assess due to telemed visit Patient leans: unable to assess due to telemed visit  Psychiatric Specialty Exam: Review of Systems  There were no vitals taken for this visit.There is no height or weight on file to calculate BMI.  General Appearance: Fairly groomed  Eye Contact:  good  Speech:  Clear and Coherent and Normal Rate  Volume:  Normal  Mood:  Depressed  Affect:  Congruent  Thought Process:  Goal Directed, Linear and Descriptions of Associations: Intact  Orientation:  Full (Time, Place, and Person)  Thought Content: Logical   Suicidal Thoughts:  No  Homicidal Thoughts:  No  Memory:  Recent;   Good Remote;   Good  Judgement:  Fair  Insight:  Fair  Psychomotor Activity:  Normal  Concentration:  Concentration: Good and Attention Span: Good  Recall:  Good  Fund of Knowledge: Good  Language: Good  Akathisia:  Negative  Handed:  Right  AIMS (if indicated): not done  Assets:  Communication Skills Desire for Improvement Financial Resources/Insurance Housing Social Support  ADL's:  Intact  Cognition: WNL  Sleep:  Fair    Assessment and Plan: Patient continues to report depressive symptoms, this month is her grandmother's 14th death anniversary.  Try Viibryd due to prior authorization issues.  She still been taking Effexor.  The office will take care of the prior authorization for Viibryd.  She is looking forward to her wedding that is coming up in May.  She was advised to taper off the Effexor in 2 weeks and then  discontinue and then start Viibryd.  1. MDD (major depressive disorder), recurrent episode, mild (HCC)  - Taper off Effexor XR in 2 weeks, then stop and then start Viibryd. - Start Vilazodone HCl (VIIBRYD) 40 MG TABS; Take 1 tablet (40 mg total) by mouth daily with breakfast.  Dispense: 30 tablet; Refill: 1 - QUEtiapine (SEROQUEL) 50 MG tablet; Take 2 tablets (100 mg total) by mouth at bedtime.  Dispense: 90 tablet; Refill: 0  2. GAD (generalized anxiety disorder)  - Vilazodone HCl (VIIBRYD) 40 MG TABS; Take 1 tablet (40 mg total) by mouth daily with breakfast.  Dispense: 30 tablet; Refill: 1 - clonazePAM (KLONOPIN) 1 MG tablet; Take 1 tablet (1 mg total) by mouth 2 (two) times daily as needed for anxiety.  Dispense: 60 tablet; Refill: 1  3. PTSD (post-traumatic stress disorder)  - Vilazodone HCl (VIIBRYD) 40 MG TABS; Take 1 tablet (  40 mg total) by mouth daily with breakfast.  Dispense: 30 tablet; Refill: 1  F/up in 2 months.  Zena Amos, MD 12/23/2020, 2:52 PM

## 2020-12-29 ENCOUNTER — Telehealth (HOSPITAL_COMMUNITY): Payer: Self-pay | Admitting: *Deleted

## 2020-12-29 NOTE — Telephone Encounter (Signed)
[place] [employer] [place] APPROVED Vilazodone HCl (VIIBRYD) 40 MG TABS  CASE# [id number] QUANTITY: 90  DURATION: 90  APPROVED  12 MONTHS: 12-29-20 THRU 12-29-21

## 2021-01-04 ENCOUNTER — Other Ambulatory Visit (HOSPITAL_COMMUNITY): Payer: Self-pay | Admitting: Psychiatry

## 2021-01-04 DIAGNOSIS — F4310 Post-traumatic stress disorder, unspecified: Secondary | ICD-10-CM

## 2021-01-04 DIAGNOSIS — F3341 Major depressive disorder, recurrent, in partial remission: Secondary | ICD-10-CM

## 2021-01-04 DIAGNOSIS — F411 Generalized anxiety disorder: Secondary | ICD-10-CM

## 2021-02-02 ENCOUNTER — Other Ambulatory Visit (HOSPITAL_COMMUNITY): Payer: Self-pay | Admitting: Psychiatry

## 2021-02-02 DIAGNOSIS — F3341 Major depressive disorder, recurrent, in partial remission: Secondary | ICD-10-CM

## 2021-02-02 DIAGNOSIS — F4310 Post-traumatic stress disorder, unspecified: Secondary | ICD-10-CM

## 2021-02-02 DIAGNOSIS — F330 Major depressive disorder, recurrent, mild: Secondary | ICD-10-CM

## 2021-02-02 DIAGNOSIS — F411 Generalized anxiety disorder: Secondary | ICD-10-CM

## 2021-02-18 ENCOUNTER — Telehealth (INDEPENDENT_AMBULATORY_CARE_PROVIDER_SITE_OTHER): Payer: Medicaid Other | Admitting: Psychiatry

## 2021-02-18 ENCOUNTER — Encounter (HOSPITAL_COMMUNITY): Payer: Self-pay | Admitting: Psychiatry

## 2021-02-18 ENCOUNTER — Other Ambulatory Visit: Payer: Self-pay

## 2021-02-18 DIAGNOSIS — F4310 Post-traumatic stress disorder, unspecified: Secondary | ICD-10-CM

## 2021-02-18 DIAGNOSIS — F3342 Major depressive disorder, recurrent, in full remission: Secondary | ICD-10-CM | POA: Diagnosis not present

## 2021-02-18 DIAGNOSIS — F411 Generalized anxiety disorder: Secondary | ICD-10-CM | POA: Diagnosis not present

## 2021-02-18 MED ORDER — CLONAZEPAM 1 MG PO TABS: 1.0000 mg | ORAL_TABLET | Freq: Two times a day (BID) | ORAL | 1 refills | Status: DC | PRN

## 2021-02-18 MED ORDER — QUETIAPINE FUMARATE 100 MG PO TABS: 100.0000 mg | ORAL_TABLET | Freq: Every day | ORAL | 1 refills | Status: DC

## 2021-02-18 MED ORDER — VENLAFAXINE HCL ER 150 MG PO CP24: 150.0000 mg | ORAL_CAPSULE | Freq: Every day | ORAL | 1 refills | Status: DC

## 2021-02-18 NOTE — Progress Notes (Signed)
BH MD OP Progress Note  Virtual Visit via Telephone Note  I connected with Regina Weber on 02/18/21 at  2:20 PM EDT by telephone and verified that I am speaking with the correct person using two identifiers.  Location: Patient: home Provider: Clinic   I discussed the limitations, risks, security and privacy concerns of performing an evaluation and management service by telephone and the availability of in person appointments. I also discussed with the patient that there may be a patient responsible charge related to this service. The patient expressed understanding and agreed to proceed.   I provided 14 minutes of non-face-to-face time during this encounter.      02/18/2021 2:33 PM Regina Weber  MRN:  [id number]  Chief Complaint:  "I am nervous, 24 days left for the wedding."  HPI: Patient informed that she is somewhat nervous but also excited about her upcoming wedding in May.  She informed that she has been with her partner for 8 years and they are officially going to be married in 24 days from now.  She stated that her partner has been married twice in the past and he is also being nervous.  She stated that this will be her first marriage. She stated that overall her mood has been stable.  She informed that she did not pick up the prescription for Viibryd because it is too expensive even with insurance.  She informed that she would have to pay a very hefty co-pay. She stated that she does not mind staying on Effexor and requested refills for that.  She stated that she would like to continue her clonazepam dose at the same dose for now because it is helping her with the anxiety. Writer informed her that for now we can continue the same dose but may be in the near future she should look into the option of cutting down the dose and she expressed her understanding. We will continue Seroquel to help her with sleep at night.  She denied any other concerns or issues today. Writer wished  her all the best for her upcoming wedding next month.  Past medications tried: Sertraline-did not help much, Prozac-felt it made her symptoms worse, Duloxetine did help initially but stopped being effective after some time, Effexor did not help much  Visit Diagnosis:    ICD-10-CM   1. GAD (generalized anxiety disorder)  F41.1 venlafaxine XR (EFFEXOR-XR) 150 MG 24 hr capsule    clonazePAM (KLONOPIN) 1 MG tablet    QUEtiapine (SEROQUEL) 100 MG tablet  2. MDD (major depressive disorder), recurrent, in full remission (HCC)  F33.42   3. PTSD (post-traumatic stress disorder)  F43.10     Past Psychiatric History: Depression, PTSD, anxiety  Past Medical History: No past medical history on file. No past surgical history on file.   Family History: No family history on file.  Social History:  Social History   Socioeconomic History  . Marital status: Single    Spouse name: Not on file  . Number of children: Not on file  . Years of education: Not on file  . Highest education level: Not on file  Occupational History  . Not on file  Tobacco Use  . Smoking status: Current Some Day Smoker  . Smokeless tobacco: Never Used  Substance and Sexual Activity  . Alcohol use: Not Currently  . Drug use: Never  . Sexual activity: Not on file  Other Topics Concern  . Not on file  Social History Narrative  . Not  on file   Social Determinants of Health   Financial Resource Strain: Not on file  Food Insecurity: Not on file  Transportation Needs: Not on file  Physical Activity: Not on file  Stress: Not on file  Social Connections: Not on file    Allergies: Not on File  Metabolic Disorder Labs: No results found for: HGBA1C, MPG No results found for: PROLACTIN No results found for: CHOL, TRIG, HDL, CHOLHDL, VLDL, LDLCALC No results found for: TSH  Therapeutic Level Labs: No results found for: LITHIUM No results found for: VALPROATE No components found for:  CBMZ  Current  Medications: Current Outpatient Medications  Medication Sig Dispense Refill  . QUEtiapine (SEROQUEL) 100 MG tablet Take 1 tablet (100 mg total) by mouth at bedtime. 30 tablet 1  . venlafaxine XR (EFFEXOR-XR) 150 MG 24 hr capsule Take 1 capsule (150 mg total) by mouth daily with breakfast. 30 capsule 1  . clonazePAM (KLONOPIN) 1 MG tablet Take 1 tablet (1 mg total) by mouth 2 (two) times daily as needed for anxiety. 60 tablet 1   No current facility-administered medications for this visit.     Musculoskeletal: Strength & Muscle Tone: unable to assess due to telemed visit Gait & Station: unable to assess due to telemed visit Patient leans: unable to assess due to telemed visit  Psychiatric Specialty Exam: Review of Systems  There were no vitals taken for this visit.There is no height or weight on file to calculate BMI.  General Appearance: Fairly groomed  Eye Contact:  good  Speech:  Clear and Coherent and Normal Rate  Volume:  Normal  Mood:  Depressed  Affect:  Congruent  Thought Process:  Goal Directed, Linear and Descriptions of Associations: Intact  Orientation:  Full (Time, Place, and Person)  Thought Content: Logical   Suicidal Thoughts:  No  Homicidal Thoughts:  No  Memory:  Recent;   Good Remote;   Good  Judgement:  Fair  Insight:  Fair  Psychomotor Activity:  Normal  Concentration:  Concentration: Good and Attention Span: Good  Recall:  Good  Fund of Knowledge: Good  Language: Good  Akathisia:  Negative  Handed:  Right  AIMS (if indicated): not done  Assets:  Communication Skills Desire for Improvement Financial Resources/Insurance Housing Social Support  ADL's:  Intact  Cognition: WNL  Sleep:  Fair    Assessment and Plan: Patient is excited about her upcoming wedding on May 14.  She stated that her mood has been stable and she would like to continue taking Effexor because Viibryd will be too costly even with insurance.  She denied any other concerns  today.  1. GAD (generalized anxiety disorder)  - Continue venlafaxine XR (EFFEXOR-XR) 150 MG 24 hr capsule; Take 1 capsule (150 mg total) by mouth daily with breakfast.  Dispense: 30 capsule; Refill: 1 - Pt informed that Viibryd had a very costly co-pay amount so she can not afford it. - clonazePAM (KLONOPIN) 1 MG tablet; Take 1 tablet (1 mg total) by mouth 2 (two) times daily as needed for anxiety.  Dispense: 60 tablet; Refill: 1 - QUEtiapine (SEROQUEL) 100 MG tablet; Take 1 tablet (100 mg total) by mouth at bedtime.  Dispense: 30 tablet; Refill: 1    2. MDD (major depressive disorder), recurrent, in full remission (HCC) - venlafaxine XR (EFFEXOR-XR) 150 MG 24 hr capsule; Take 1 capsule (150 mg total) by mouth daily with breakfast.  Dispense: 30 capsule; Refill: 1   3. PTSD (post-traumatic stress disorder) -  venlafaxine XR (EFFEXOR-XR) 150 MG 24 hr capsule; Take 1 capsule (150 mg total) by mouth daily with breakfast.  Dispense: 30 capsule; Refill: 1   F/up in 2 months. Provider informed the patient that her care is being transferred to a different psychiatry provider with [facility] psychiatry clinic.  Patient verbalized her understanding.   Zena Amos, MD 02/18/2021, 2:33 PM

## 2021-03-18 ENCOUNTER — Telehealth (HOSPITAL_COMMUNITY): Payer: Self-pay | Admitting: *Deleted

## 2021-03-18 ENCOUNTER — Other Ambulatory Visit (HOSPITAL_COMMUNITY): Payer: Self-pay | Admitting: Psychiatry

## 2021-03-18 DIAGNOSIS — F411 Generalized anxiety disorder: Secondary | ICD-10-CM

## 2021-03-18 MED ORDER — VENLAFAXINE HCL ER 150 MG PO CP24: 150.0000 mg | ORAL_CAPSULE | Freq: Every day | ORAL | 1 refills | Status: DC

## 2021-03-18 MED ORDER — QUETIAPINE FUMARATE 100 MG PO TABS: 100.0000 mg | ORAL_TABLET | Freq: Every day | ORAL | 1 refills | Status: DC

## 2021-03-18 MED ORDER — CLONAZEPAM 1 MG PO TABS: 1.0000 mg | ORAL_TABLET | Freq: Two times a day (BID) | ORAL | 1 refills | Status: DC | PRN

## 2021-03-18 NOTE — Addendum Note (Signed)
Addended by: Zena Amos on: 03/18/2021 01:35 PM   Modules accepted: Orders

## 2021-03-18 NOTE — Telephone Encounter (Signed)
Notice from her preferred pharmacy re meds but she has a refill available on all of them. Spoke with the pharmacist and she is picking up her last refill today and her next appt is with Dr Vanetta Shawl on 6/21 so she will need meds to make it to her new provider. Will forward concern to Dr to see if she will give her a one more rx to get to her next appt on 6/21

## 2021-03-18 NOTE — Telephone Encounter (Signed)
Rx resent.
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
## 2021-04-20 ENCOUNTER — Telehealth (INDEPENDENT_AMBULATORY_CARE_PROVIDER_SITE_OTHER): Payer: Medicaid Other | Admitting: Psychiatry

## 2021-04-20 ENCOUNTER — Other Ambulatory Visit: Payer: Self-pay

## 2021-04-20 ENCOUNTER — Encounter: Payer: Self-pay | Admitting: Psychiatry

## 2021-04-20 DIAGNOSIS — F3341 Major depressive disorder, recurrent, in partial remission: Secondary | ICD-10-CM | POA: Diagnosis not present

## 2021-04-20 DIAGNOSIS — F411 Generalized anxiety disorder: Secondary | ICD-10-CM

## 2021-04-20 MED ORDER — QUETIAPINE FUMARATE 100 MG PO TABS: 100.0000 mg | ORAL_TABLET | Freq: Every day | ORAL | 0 refills | Status: DC

## 2021-04-20 MED ORDER — VENLAFAXINE HCL ER 150 MG PO CP24: 150.0000 mg | ORAL_CAPSULE | Freq: Every day | ORAL | 0 refills | Status: DC

## 2021-04-20 MED ORDER — CLONAZEPAM 1 MG PO TABS: 1.0000 mg | ORAL_TABLET | Freq: Two times a day (BID) | ORAL | 0 refills | Status: DC
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
## 2021-06-15 ENCOUNTER — Telehealth (INDEPENDENT_AMBULATORY_CARE_PROVIDER_SITE_OTHER): Payer: Medicaid Other | Admitting: Psychiatry

## 2021-06-15 ENCOUNTER — Other Ambulatory Visit: Payer: Self-pay

## 2021-06-15 ENCOUNTER — Encounter: Payer: Self-pay | Admitting: Psychiatry

## 2021-06-15 DIAGNOSIS — F411 Generalized anxiety disorder: Secondary | ICD-10-CM

## 2021-06-15 DIAGNOSIS — F330 Major depressive disorder, recurrent, mild: Secondary | ICD-10-CM

## 2021-06-15 DIAGNOSIS — F4310 Post-traumatic stress disorder, unspecified: Secondary | ICD-10-CM

## 2021-06-15 MED ORDER — CLONAZEPAM 1 MG PO TABS: 1.0000 mg | ORAL_TABLET | Freq: Two times a day (BID) | ORAL | 1 refills | Status: DC | PRN

## 2021-06-15 MED ORDER — QUETIAPINE FUMARATE 100 MG PO TABS: 100.0000 mg | ORAL_TABLET | Freq: Every day | ORAL | 2 refills | Status: DC

## 2021-06-15 MED ORDER — VENLAFAXINE HCL ER 150 MG PO CP24: 150.0000 mg | ORAL_CAPSULE | Freq: Every day | ORAL | 2 refills | Status: DC

## 2021-06-15 NOTE — Patient Instructions (Signed)
Continue  venlafaxine 150 mg daily Continue quetiapine 100 mg at night, Continue clonazepam 1 mg twice a day as needed for anxiety, Next appointment - 10/24 at 2 PM
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
## 2021-08-23 ENCOUNTER — Ambulatory Visit: Payer: Medicaid Other | Admitting: Psychiatry
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
## 2021-09-02 ENCOUNTER — Telehealth (INDEPENDENT_AMBULATORY_CARE_PROVIDER_SITE_OTHER): Payer: Medicaid Other | Admitting: Psychiatry

## 2021-09-02 ENCOUNTER — Encounter: Payer: Self-pay | Admitting: Psychiatry

## 2021-09-02 ENCOUNTER — Other Ambulatory Visit: Payer: Self-pay

## 2021-09-02 DIAGNOSIS — F411 Generalized anxiety disorder: Secondary | ICD-10-CM | POA: Diagnosis not present

## 2021-09-02 DIAGNOSIS — F330 Major depressive disorder, recurrent, mild: Secondary | ICD-10-CM | POA: Diagnosis not present

## 2021-09-02 DIAGNOSIS — F4310 Post-traumatic stress disorder, unspecified: Secondary | ICD-10-CM

## 2021-09-02 MED ORDER — CLONAZEPAM 1 MG PO TABS: 1.0000 mg | ORAL_TABLET | Freq: Two times a day (BID) | ORAL | 2 refills | Status: DC | PRN

## 2021-09-02 MED ORDER — VENLAFAXINE HCL ER 75 MG PO CP24: 225.0000 mg | ORAL_CAPSULE | Freq: Every day | ORAL | 2 refills | Status: DC

## 2021-09-02 MED ORDER — QUETIAPINE FUMARATE 100 MG PO TABS: 100.0000 mg | ORAL_TABLET | Freq: Every day | ORAL | 2 refills | Status: DC

## 2021-09-21 ENCOUNTER — Telehealth: Payer: Self-pay

## 2021-09-21 NOTE — Telephone Encounter (Signed)
received notice on [email or web address] that a prior auth is needed on quetiapine fumarate 100mg 

## 2021-09-21 NOTE — Telephone Encounter (Signed)
went online and submitted the prior auth - pending ?

## 2021-11-10 ENCOUNTER — Other Ambulatory Visit: Payer: Self-pay | Admitting: Psychiatry

## 2021-11-10 DIAGNOSIS — F411 Generalized anxiety disorder: Secondary | ICD-10-CM

## 2021-11-11 DIAGNOSIS — M25571 Pain in right ankle and joints of right foot: Secondary | ICD-10-CM | POA: Diagnosis not present

## 2021-11-11 DIAGNOSIS — M65871 Other synovitis and tenosynovitis, right ankle and foot: Secondary | ICD-10-CM | POA: Diagnosis not present

## 2021-11-17 DIAGNOSIS — M25541 Pain in joints of right hand: Secondary | ICD-10-CM | POA: Diagnosis not present

## 2021-11-17 DIAGNOSIS — S6721XD Crushing injury of right hand, subsequent encounter: Secondary | ICD-10-CM | POA: Diagnosis not present

## 2021-11-17 DIAGNOSIS — S60221D Contusion of right hand, subsequent encounter: Secondary | ICD-10-CM | POA: Diagnosis not present
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
## 2021-11-29 ENCOUNTER — Ambulatory Visit: Payer: Medicaid Other | Admitting: Psychiatry
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
## 2021-12-16 ENCOUNTER — Ambulatory Visit: Payer: Medicaid Other | Admitting: Psychiatry
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
## 2022-01-06 ENCOUNTER — Ambulatory Visit: Payer: Medicaid Other | Admitting: Psychiatry
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
## 2022-01-31 ENCOUNTER — Telehealth: Payer: Self-pay | Admitting: Psychiatry

## 2022-01-31 NOTE — Telephone Encounter (Signed)
Patient called to confirm appointment for tomorrow 02/01/2022 at 1600.  Advised to arrive 15 minutes earlier at 1545 as appointment is in-person. ?

## 2022-02-01 ENCOUNTER — Ambulatory Visit (INDEPENDENT_AMBULATORY_CARE_PROVIDER_SITE_OTHER): Payer: Medicaid Other | Admitting: Psychiatry

## 2022-02-01 ENCOUNTER — Encounter: Payer: Self-pay | Admitting: Psychiatry

## 2022-02-01 VITALS — BP 138/87 | HR 78 | Temp 97.8°F | Wt 344.8 lb

## 2022-02-01 DIAGNOSIS — F411 Generalized anxiety disorder: Secondary | ICD-10-CM

## 2022-02-01 DIAGNOSIS — F3342 Major depressive disorder, recurrent, in full remission: Secondary | ICD-10-CM

## 2022-02-01 DIAGNOSIS — R69 Illness, unspecified: Secondary | ICD-10-CM | POA: Diagnosis not present

## 2022-02-01 DIAGNOSIS — F4310 Post-traumatic stress disorder, unspecified: Secondary | ICD-10-CM

## 2022-02-01 MED ORDER — METFORMIN HCL ER 500 MG PO TB24: 500.0000 mg | ORAL_TABLET | Freq: Every day | ORAL | 1 refills | Status: DC

## 2022-02-01 MED ORDER — CLONAZEPAM 1 MG PO TABS: 1.0000 mg | ORAL_TABLET | Freq: Two times a day (BID) | ORAL | 2 refills | Status: DC | PRN

## 2022-02-25 DIAGNOSIS — M25571 Pain in right ankle and joints of right foot: Secondary | ICD-10-CM | POA: Diagnosis not present

## 2022-02-25 DIAGNOSIS — R69 Illness, unspecified: Secondary | ICD-10-CM | POA: Diagnosis not present

## 2022-02-25 DIAGNOSIS — M1712 Unilateral primary osteoarthritis, left knee: Secondary | ICD-10-CM | POA: Diagnosis not present

## 2022-02-25 DIAGNOSIS — E559 Vitamin D deficiency, unspecified: Secondary | ICD-10-CM | POA: Diagnosis not present

## 2022-02-25 DIAGNOSIS — M25521 Pain in right elbow: Secondary | ICD-10-CM | POA: Diagnosis not present

## 2022-02-25 DIAGNOSIS — L989 Disorder of the skin and subcutaneous tissue, unspecified: Secondary | ICD-10-CM | POA: Diagnosis not present

## 2022-02-25 DIAGNOSIS — Z1231 Encounter for screening mammogram for malignant neoplasm of breast: Secondary | ICD-10-CM | POA: Diagnosis not present

## 2022-02-25 DIAGNOSIS — E538 Deficiency of other specified B group vitamins: Secondary | ICD-10-CM | POA: Diagnosis not present

## 2022-02-25 DIAGNOSIS — M25562 Pain in left knee: Secondary | ICD-10-CM | POA: Diagnosis not present

## 2022-02-28 ENCOUNTER — Other Ambulatory Visit: Payer: Self-pay | Admitting: Internal Medicine

## 2022-02-28 DIAGNOSIS — Z1231 Encounter for screening mammogram for malignant neoplasm of breast: Secondary | ICD-10-CM

## 2022-03-13 ENCOUNTER — Emergency Department (HOSPITAL_COMMUNITY): Payer: 59

## 2022-03-13 ENCOUNTER — Other Ambulatory Visit: Payer: Self-pay

## 2022-03-13 ENCOUNTER — Encounter (HOSPITAL_COMMUNITY): Payer: Self-pay | Admitting: *Deleted

## 2022-03-13 DIAGNOSIS — S8391XA Sprain of unspecified site of right knee, initial encounter: Secondary | ICD-10-CM | POA: Diagnosis not present

## 2022-03-13 DIAGNOSIS — S93401A Sprain of unspecified ligament of right ankle, initial encounter: Secondary | ICD-10-CM | POA: Insufficient documentation

## 2022-03-13 DIAGNOSIS — M7989 Other specified soft tissue disorders: Secondary | ICD-10-CM | POA: Diagnosis not present

## 2022-03-13 DIAGNOSIS — W010XXA Fall on same level from slipping, tripping and stumbling without subsequent striking against object, initial encounter: Secondary | ICD-10-CM | POA: Insufficient documentation

## 2022-03-13 DIAGNOSIS — M25561 Pain in right knee: Secondary | ICD-10-CM | POA: Diagnosis not present

## 2022-03-13 DIAGNOSIS — S8991XA Unspecified injury of right lower leg, initial encounter: Secondary | ICD-10-CM | POA: Diagnosis not present

## 2022-03-13 NOTE — ED Triage Notes (Signed)
Pt fell few days ago after tripping. C/o right knee since after landing on it. Denies hitting head with the fall. Denies blood thinners.  ?

## 2022-03-14 ENCOUNTER — Emergency Department (HOSPITAL_COMMUNITY)
Admission: EM | Admit: 2022-03-14 | Discharge: 2022-03-14 | Disposition: A | Discharge status: Home / Self Care | Payer: 59 | Attending: Emergency Medicine | Admitting: Emergency Medicine

## 2022-03-14 ENCOUNTER — Encounter: Payer: Self-pay | Admitting: Psychiatry

## 2022-03-14 ENCOUNTER — Emergency Department (HOSPITAL_COMMUNITY): Payer: 59

## 2022-03-14 DIAGNOSIS — M7989 Other specified soft tissue disorders: Secondary | ICD-10-CM | POA: Diagnosis not present

## 2022-03-14 DIAGNOSIS — S8391XA Sprain of unspecified site of right knee, initial encounter: Secondary | ICD-10-CM | POA: Diagnosis not present

## 2022-03-14 DIAGNOSIS — S93401A Sprain of unspecified ligament of right ankle, initial encounter: Secondary | ICD-10-CM

## 2022-03-14 HISTORY — DX: Pure hypercholesterolemia, unspecified: E78.00

## 2022-03-14 MED ORDER — IBUPROFEN 400 MG PO TABS
400.0000 mg | ORAL_TABLET | Freq: Once | ORAL | Status: AC
  Administered 2022-03-14: 400 mg via ORAL
  Filled 2022-03-14: qty 1

## 2022-03-14 MED ORDER — HYDROCODONE-ACETAMINOPHEN 5-325 MG PO TABS
1.0000 | ORAL_TABLET | Freq: Once | ORAL | Status: AC
  Administered 2022-03-14: 1 via ORAL
  Filled 2022-03-14: qty 1

## 2022-03-14 NOTE — ED Provider Notes (Signed)
?[facility] DEPART[facility] ?Provider Note ? ? ?CSN: [id number] ?Arrival date & time: 03/13/22  2149 ? ?  ? ?History ? ?Chief Complaint  ?Patient presents with  ? Knee Pain  ? ? ?Mykeria Garman is a 41 y.o. female. ? ? ?Knee Pain ?Location:  Knee ?Time since incident:  2 days ?Injury: yes   ?Mechanism of injury: fall   ?Knee location:  R knee ?Pain details:  ?  Quality:  Aching ?  Severity:  Severe ?  Onset quality:  Sudden ?  Timing:  Constant ?  Progression:  Worsening ?Chronicity:  New ?Relieved by:  Nothing ?Worsened by:  Bearing weight ?Associated symptoms: decreased ROM   ?Patient reports he tripped over 2 days ago landing on her right knee and both hands. ?No injuries to her hands, reports increasing pain in her right ankle and knee.  No head injury, no LOC ?  ? ?Home Medications ?Prior to Admission medications   ?Not on File  ?   ? ?Allergies    ?Patient has no known allergies.   ? ?Review of Systems   ?Review of Systems  ?Musculoskeletal:  Positive for arthralgias.  ?Neurological:  Negative for headaches.  ? ?Physical Exam ?Updated Vital Signs ?BP 121/75   Pulse 66   Temp 98.7 ?F (37.1 ?C) (Oral)   Resp 17   Ht 1.676 m (5\' 6" )   Wt (!) 149.7 kg   LMP 01/29/2022 Comment: irregular periods  SpO2 99%   BMI 53.26 kg/m?  ?Physical Exam ?CONSTITUTIONAL: Well developed/well nourished ,crying ?HEAD: Normocephalic/atraumatic ?NECK: supple no meningeal signs ?SPINE/BACK:entire spine nontender ?NEURO: Pt is awake/alert/appropriate, moves all extremitiesx4.  No facial droop.   ?EXTREMITIES: pulses normal/equal, full ROM, distal pulses equal and intact.  Diffuse tenderness to right lateral malleolus.  Diffuse tenderness noted right patella.  There is no deformities.  She can flex the right knee but limited due to pain ?No tenderness of range of motion of right hip ?Left lower extremity is nontender to palpation and range of motion ?Equal handgrips, no signs of trauma to her hands ?SKIN: warm, color  normal ?PSYCH: no abnormalities of mood noted, alert and oriented to situation ? ?ED Results / Procedures / Treatments   ?Labs ?(all labs ordered are listed, but only abnormal results are displayed) ?Labs Reviewed - No data to display ? ?EKG ?None ? ?Radiology ?DG Ankle Complete Right ? ?Result Date: 03/14/2022 ?CLINICAL DATA:  Status post fall. EXAM: RIGHT ANKLE - COMPLETE 3+ VIEW COMPARISON:  None Available. FINDINGS: There is no evidence of fracture, dislocation, or joint effusion. A large plantar calcaneal spur is seen. There is no evidence of arthropathy. There is moderate severity diffuse soft tissue swelling. IMPRESSION: Moderate severity diffuse soft tissue swelling without an acute osseous abnormality. Electronically Signed   By: 03/16/2022 M.D.   On: 03/14/2022 01:36  ? ?DG Knee Complete 4 Views Right ? ?Result Date: 03/13/2022 ?CLINICAL DATA:  Fall, right knee pain EXAM: RIGHT KNEE - COMPLETE 4+ VIEW COMPARISON:  None Available. FINDINGS: No fracture or dislocation is seen. The joint spaces are preserved. Visualized soft tissues are within normal limits. No suprapatellar knee joint effusion. IMPRESSION: Negative. Electronically Signed   By: 03/15/2022 M.D.   On: 03/13/2022 22:58   ? ?Procedures ?Procedures  ? ? ?Medications Ordered in ED ?Medications  ?HYDROcodone-acetaminophen (NORCO/VICODIN) 5-325 MG per tablet 1 tablet (1 tablet Oral Given 03/14/22 0133)  ?ibuprofen (ADVIL) tablet 400 mg (400 mg Oral Given 03/14/22 0133)  ? ? ?  ED Course/ Medical Decision Making/ A&P ?  ?                        ?Medical Decision Making ?Amount and/or Complexity of Data Reviewed ?Radiology: ordered. ? ?Risk ?Prescription drug management. ? ? ?Patient presents at least 2 days since the fall.  Reports increasing pain in her right knee and ankle.  I have personally visualized the x-rays and they are negative. ?I found another epic chart for patient under [facility] Fearing  ?MRN:  [id number] ?She has been seen multiple  times for variety of orthopedic injuries due to fall.  Patient be provided crutches and referred to orthopedic ? ? ? ? ? ? ?Final Clinical Impression(s) / ED Diagnoses ?Final diagnoses:  ?Sprain of right knee, unspecified ligament, initial encounter  ?Sprain of right ankle, unspecified ligament, initial encounter  ? ? ?Rx / DC Orders ?ED Discharge Orders   ? ? None  ? ?  ? ? ?  ?Zadie Rhine, MD ?03/14/22 [id number] ? ?
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
## 2022-03-30 ENCOUNTER — Other Ambulatory Visit: Payer: Self-pay | Admitting: Psychiatry

## 2022-03-30 DIAGNOSIS — F411 Generalized anxiety disorder: Secondary | ICD-10-CM

## 2022-03-31 ENCOUNTER — Encounter: Payer: Self-pay | Admitting: Psychiatry

## 2022-03-31 ENCOUNTER — Telehealth (INDEPENDENT_AMBULATORY_CARE_PROVIDER_SITE_OTHER): Payer: 59 | Admitting: Psychiatry

## 2022-03-31 DIAGNOSIS — F4310 Post-traumatic stress disorder, unspecified: Secondary | ICD-10-CM | POA: Diagnosis not present

## 2022-03-31 DIAGNOSIS — R69 Illness, unspecified: Secondary | ICD-10-CM | POA: Diagnosis not present

## 2022-03-31 DIAGNOSIS — F330 Major depressive disorder, recurrent, mild: Secondary | ICD-10-CM | POA: Diagnosis not present

## 2022-03-31 DIAGNOSIS — F411 Generalized anxiety disorder: Secondary | ICD-10-CM

## 2022-03-31 MED ORDER — METFORMIN HCL ER 500 MG PO TB24: 500.0000 mg | ORAL_TABLET | Freq: Every day | ORAL | 1 refills | Status: DC

## 2022-03-31 MED ORDER — QUETIAPINE FUMARATE 100 MG PO TABS: 100.0000 mg | ORAL_TABLET | Freq: Every day | ORAL | 1 refills | Status: DC

## 2022-03-31 NOTE — Patient Instructions (Signed)
Increase venlafaxine 225 mg daily  Continue quetiapine 100 mg at night Continue metformin 500 mg daily Continue clonazepam 1 mg twice a day as needed for anxiety Next appointment -  7/6 at 4 PM, in person  The next visit will be in person visit. Please arrive 15 mins before the scheduled time.   [facility]  Address: [address], [place], [place] [address]
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
## 2022-05-05 ENCOUNTER — Ambulatory Visit: Payer: Medicaid Other | Admitting: Psychiatry

## 2022-05-11 DIAGNOSIS — Y92000 Kitchen of unspecified non-institutional (private) residence as  the place of occurrence of the external cause: Secondary | ICD-10-CM | POA: Diagnosis not present

## 2022-05-11 DIAGNOSIS — M79602 Pain in left arm: Secondary | ICD-10-CM | POA: Insufficient documentation

## 2022-05-11 DIAGNOSIS — W19XXXA Unspecified fall, initial encounter: Secondary | ICD-10-CM | POA: Insufficient documentation

## 2022-05-12 ENCOUNTER — Emergency Department (HOSPITAL_COMMUNITY): Payer: 59

## 2022-05-12 ENCOUNTER — Emergency Department (HOSPITAL_COMMUNITY)
Admission: EM | Admit: 2022-05-12 | Discharge: 2022-05-12 | Disposition: A | Discharge status: Home / Self Care | Payer: 59 | Attending: Emergency Medicine | Admitting: Emergency Medicine

## 2022-05-12 ENCOUNTER — Encounter (HOSPITAL_COMMUNITY): Payer: Self-pay

## 2022-05-12 ENCOUNTER — Other Ambulatory Visit: Payer: Self-pay

## 2022-05-12 DIAGNOSIS — M79602 Pain in left arm: Secondary | ICD-10-CM | POA: Diagnosis not present

## 2022-05-12 DIAGNOSIS — S4992XA Unspecified injury of left shoulder and upper arm, initial encounter: Secondary | ICD-10-CM | POA: Diagnosis not present

## 2022-05-12 MED ORDER — KETOROLAC TROMETHAMINE 60 MG/2ML IM SOLN
15.0000 mg | Freq: Once | INTRAMUSCULAR | Status: AC
  Administered 2022-05-12: 15 mg via INTRAMUSCULAR
  Filled 2022-05-12: qty 2

## 2022-05-12 NOTE — ED Triage Notes (Signed)
Pt arrived from home via POV w c/o left arm pain status post fall into her kitchen table. Pain 7/10. Pt denies hitting her head.

## 2022-05-12 NOTE — ED Provider Notes (Signed)
[facility] EMERGENCY DEPARTMENT Provider Note   CSN: [id number] Arrival date & time: 05/11/22  2307     History  Chief Complaint  Patient presents with   Arm Injury    Stephnie Nored is a 41 y.o. female.  States she fell over her husbands wheelchair and landed against a table hurting her left arm around the elbow. No other injuries. No syncope. No recent illnesses.    Arm Injury      Home Medications Prior to Admission medications   Medication Sig Start Date End Date Taking? Authorizing Provider  atorvastatin (LIPITOR) 40 MG tablet Take 40 mg by mouth daily. 02/18/21 02/18/22  [provider]  Cetirizine HCl 10 MG CAPS Take 10 mg by mouth daily. 12/16/19   [provider]  clonazePAM (KLONOPIN) 1 MG tablet Take 1 tablet (1 mg total) by mouth 2 (two) times daily as needed for anxiety. 02/01/22 05/02/22  Neysa Hotter, MD  cyclobenzaprine (FLEXERIL) 10 MG tablet TAKE 1 TABLET BY MOUTH AT BEDTIME AS NEEDED FOR MUSCLE SPASMS (LOW BACK PAIN) 11/10/21   [provider]  EPINEPHrine 0.3 mg/0.3 mL IJ SOAJ injection  10/04/21   [provider]  ergocalciferol (VITAMIN D2) 1.25 MG (50000 UT) capsule Take 1,250 mcg by mouth daily. 02/18/21   [provider]  fluticasone (FLONASE) 50 MCG/ACT nasal spray Place into both nostrils. 11/10/21   [provider]  gabapentin (NEURONTIN) 300 MG capsule Take 300 mg by mouth daily. 11/10/21   [provider]  metFORMIN (GLUCOPHAGE-XR) 500 MG 24 hr tablet Take 1 tablet (500 mg total) by mouth daily with breakfast. 04/03/22 06/02/22  Neysa Hotter, MD  norgestrel-ethinyl estradiol (LO/OVRAL) 0.3-30 MG-MCG tablet Take 1 tablet by mouth daily.    [provider]  omeprazole (PRILOSEC) 20 MG capsule Take 1 capsule by mouth daily. 11/09/20   [provider]  QUEtiapine (SEROQUEL) 100 MG tablet Take 1 tablet (100 mg total) by mouth at bedtime. 03/31/22 05/30/22  Neysa Hotter, MD  venlafaxine XR  (EFFEXOR-XR) 75 MG 24 hr capsule Take 3 capsules (225 mg total) by mouth daily with breakfast. 09/02/21 12/01/21  Neysa Hotter, MD      Allergies    Eggs or egg-derived products and Molds & smuts    Review of Systems   Review of Systems  Physical Exam Updated Vital Signs BP 124/74   Pulse 77   Temp 98 F (36.7 C) (Oral)   Resp (!) 22   SpO2 98%  Physical Exam Vitals and nursing note reviewed.  Constitutional:      Appearance: She is well-developed.  HENT:     Head: Normocephalic and atraumatic.     Mouth/Throat:     Mouth: Mucous membranes are moist.  Eyes:     Pupils: Pupils are equal, round, and reactive to light.  Cardiovascular:     Rate and Rhythm: Normal rate and regular rhythm.  Pulmonary:     Effort: No respiratory distress.     Breath sounds: No stridor.  Abdominal:     General: Abdomen is flat. There is no distension.  Musculoskeletal:        General: Tenderness (Left forearm and humerus) present.     Cervical back: Normal range of motion.     Comments: Difficult to assess for effusion in left elbow but seems a little swollen possibly  Skin:    General: Skin is warm and dry.  Neurological:     Mental Status: She is alert. Mental  status is at baseline.     Comments: R eye strabismus     ED Results / Procedures / Treatments   Labs (all labs ordered are listed, but only abnormal results are displayed) Labs Reviewed - No data to display  EKG None  Radiology DG Forearm Left  Result Date: 05/12/2022 CLINICAL DATA:  Fall, left arm pain EXAM: LEFT FOREARM - 2 VIEW COMPARISON:  None Available. FINDINGS: No fracture or dislocation is seen. The joint spaces are preserved. The visualized soft tissues are unremarkable. No displaced elbow joint fat pads on the lateral view. IMPRESSION: Negative. Electronically Signed   By: Charline Bills M.D.   On: 05/12/2022 02:05   DG Humerus Left  Result Date: 05/12/2022 CLINICAL DATA:  Fall, left arm pain EXAM: LEFT  HUMERUS - 2+ VIEW COMPARISON:  None Available. FINDINGS: No fracture or dislocation is seen. The joint spaces are preserved. The visualized soft tissues are unremarkable. Visualized left lung is clear. IMPRESSION: Negative. Electronically Signed   By: Charline Bills M.D.   On: 05/12/2022 02:05    Procedures Procedures    Medications Ordered in ED Medications  ketorolac (TORADOL) injection 15 mg (15 mg Intramuscular Given 05/12/22 0253)    ED Course/ Medical Decision Making/ A&P                           Medical Decision Making Amount and/or Complexity of Data Reviewed Radiology: ordered.  Risk Prescription drug management.  Eval for fracture vs effusion  Xr negative. Supportive care suggested at home.    Final Clinical Impression(s) / ED Diagnoses Final diagnoses:  Left arm pain    Rx / DC Orders ED Discharge Orders     None         Harrie Cazarez, Barbara Cower, MD 05/12/22 0345
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
## 2022-06-02 ENCOUNTER — Other Ambulatory Visit: Payer: Self-pay | Admitting: Psychiatry

## 2022-06-02 ENCOUNTER — Ambulatory Visit: Payer: Medicaid Other | Admitting: Psychiatry

## 2022-06-02 DIAGNOSIS — F411 Generalized anxiety disorder: Secondary | ICD-10-CM

## 2022-06-10 DIAGNOSIS — M25532 Pain in left wrist: Secondary | ICD-10-CM | POA: Diagnosis not present

## 2022-06-10 DIAGNOSIS — E559 Vitamin D deficiency, unspecified: Secondary | ICD-10-CM | POA: Diagnosis not present

## 2022-06-10 DIAGNOSIS — R69 Illness, unspecified: Secondary | ICD-10-CM | POA: Diagnosis not present

## 2022-06-30 ENCOUNTER — Ambulatory Visit: Payer: Medicaid Other | Admitting: Psychiatry

## 2022-06-30 ENCOUNTER — Other Ambulatory Visit: Payer: Self-pay | Admitting: Psychiatry
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
## 2022-07-28 ENCOUNTER — Ambulatory Visit (INDEPENDENT_AMBULATORY_CARE_PROVIDER_SITE_OTHER): Payer: 59 | Admitting: Psychiatry

## 2022-07-28 ENCOUNTER — Encounter: Payer: Self-pay | Admitting: Psychiatry

## 2022-07-28 VITALS — BP 109/72 | HR 84 | Temp 97.3°F | Ht 66.0 in | Wt 343.0 lb

## 2022-07-28 DIAGNOSIS — F4310 Post-traumatic stress disorder, unspecified: Secondary | ICD-10-CM | POA: Diagnosis not present

## 2022-07-28 DIAGNOSIS — F330 Major depressive disorder, recurrent, mild: Secondary | ICD-10-CM | POA: Diagnosis not present

## 2022-07-28 DIAGNOSIS — F411 Generalized anxiety disorder: Secondary | ICD-10-CM

## 2022-07-28 MED ORDER — METFORMIN HCL ER 500 MG PO TB24: 500.0000 mg | ORAL_TABLET | Freq: Every day | ORAL | 1 refills | Status: DC

## 2022-07-28 MED ORDER — VENLAFAXINE HCL ER 75 MG PO CP24: 225.0000 mg | ORAL_CAPSULE | Freq: Every day | ORAL | 1 refills | Status: DC

## 2022-07-28 MED ORDER — QUETIAPINE FUMARATE 100 MG PO TABS: 100.0000 mg | ORAL_TABLET | Freq: Every day | ORAL | 1 refills | Status: DC

## 2022-07-28 MED ORDER — CLONAZEPAM 1 MG PO TABS: ORAL_TABLET | ORAL | 1 refills | Status: DC

## 2022-07-28 NOTE — Patient Instructions (Signed)
Start venlafaxine 225 mg daily - start from 75 mg daily for one day, then 150 mg daily for one day, then 225 mg daily  Continue quetiapine 100 mg at night Continue metformin 500 mg daily Decrease clonazepam 1 mg daily as needed, and 0.5 mg in the afternoon as needed for anxiety  Next appointment -  11/27 at 3 PM, in person

## 2022-09-14 ENCOUNTER — Other Ambulatory Visit: Payer: Self-pay | Admitting: Psychiatry

## 2022-09-14 DIAGNOSIS — F411 Generalized anxiety disorder: Secondary | ICD-10-CM

## 2022-09-26 ENCOUNTER — Ambulatory Visit: Payer: 59 | Admitting: Psychiatry
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
## 2022-10-11 ENCOUNTER — Telehealth (INDEPENDENT_AMBULATORY_CARE_PROVIDER_SITE_OTHER): Payer: 59 | Admitting: Psychiatry

## 2022-10-11 ENCOUNTER — Encounter: Payer: Self-pay | Admitting: Psychiatry

## 2022-10-11 DIAGNOSIS — F411 Generalized anxiety disorder: Secondary | ICD-10-CM | POA: Diagnosis not present

## 2022-10-11 DIAGNOSIS — F330 Major depressive disorder, recurrent, mild: Secondary | ICD-10-CM

## 2022-10-11 DIAGNOSIS — F4310 Post-traumatic stress disorder, unspecified: Secondary | ICD-10-CM

## 2022-10-11 MED ORDER — METFORMIN HCL ER 500 MG PO TB24: 500.0000 mg | ORAL_TABLET | Freq: Every day | ORAL | 2 refills | Status: DC

## 2022-10-11 MED ORDER — VENLAFAXINE HCL ER 75 MG PO CP24: 225.0000 mg | ORAL_CAPSULE | Freq: Every day | ORAL | 2 refills | Status: DC

## 2022-10-11 MED ORDER — CLONAZEPAM 1 MG PO TABS: ORAL_TABLET | ORAL | 2 refills | Status: DC

## 2022-10-11 MED ORDER — QUETIAPINE FUMARATE 100 MG PO TABS: 100.0000 mg | ORAL_TABLET | Freq: Every day | ORAL | 2 refills | Status: DC

## 2022-10-11 NOTE — Patient Instructions (Addendum)
Continue venlafaxine 225 mg daily  Continue quetiapine 100 mg at night Continue metformin 500 mg daily Continue clonazepam 1 mg daily as needed, and 0.5 mg in the afternoon as needed for anxiety  Obtain EKG  Next appointment -  2/27 at 3 PM
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
## 2022-12-27 ENCOUNTER — Ambulatory Visit (INDEPENDENT_AMBULATORY_CARE_PROVIDER_SITE_OTHER): Payer: 59 | Admitting: Psychiatry

## 2022-12-27 ENCOUNTER — Encounter: Payer: Self-pay | Admitting: Psychiatry

## 2022-12-27 VITALS — BP 115/70 | HR 76 | Temp 97.9°F | Ht 66.0 in | Wt 326.0 lb

## 2022-12-27 DIAGNOSIS — F4310 Post-traumatic stress disorder, unspecified: Secondary | ICD-10-CM | POA: Diagnosis not present

## 2022-12-27 DIAGNOSIS — F330 Major depressive disorder, recurrent, mild: Secondary | ICD-10-CM | POA: Diagnosis not present

## 2022-12-27 DIAGNOSIS — F411 Generalized anxiety disorder: Secondary | ICD-10-CM | POA: Diagnosis not present

## 2022-12-27 MED ORDER — QUETIAPINE FUMARATE 100 MG PO TABS: 100.0000 mg | ORAL_TABLET | Freq: Every day | ORAL | 2 refills | Status: DC

## 2022-12-27 MED ORDER — VENLAFAXINE HCL ER 75 MG PO CP24: 225.0000 mg | ORAL_CAPSULE | Freq: Every day | ORAL | 3 refills | Status: DC

## 2022-12-27 MED ORDER — METFORMIN HCL ER 500 MG PO TB24: 500.0000 mg | ORAL_TABLET | Freq: Every day | ORAL | 2 refills | Status: DC

## 2022-12-27 NOTE — Patient Instructions (Signed)
Continue venlafaxine 225 mg daily Continue quetiapine 100 mg at night   Continue metformin 500 mg daily Continue clonazepam 1 mg daily as needed, and 0.5 mg in the afternoon as needed for anxiety  Next appointment -  4/17 at 11:30

## 2023-01-30 ENCOUNTER — Other Ambulatory Visit: Payer: Self-pay | Admitting: Psychiatry

## 2023-01-30 DIAGNOSIS — F411 Generalized anxiety disorder: Secondary | ICD-10-CM

## 2023-02-01 ENCOUNTER — Telehealth: Payer: Self-pay

## 2023-02-01 NOTE — Telephone Encounter (Signed)
received notice from [email or web address] that patient needed a prior auth on the quetiapine 100mg . prior Josem Kaufmann was submitted and is pending.

## 2023-02-03 NOTE — Telephone Encounter (Signed)
received fax state that they were unable to process the request. it states that patient has a alternative pharmacy benefit and that they would be the payer of last resort.   Form faxed and confirmed to pharmacy.
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
## 2023-02-15 ENCOUNTER — Telehealth (INDEPENDENT_AMBULATORY_CARE_PROVIDER_SITE_OTHER): Payer: 59 | Admitting: Psychiatry

## 2023-02-15 ENCOUNTER — Encounter: Payer: Self-pay | Admitting: Psychiatry

## 2023-02-15 DIAGNOSIS — F330 Major depressive disorder, recurrent, mild: Secondary | ICD-10-CM

## 2023-02-15 DIAGNOSIS — F4310 Post-traumatic stress disorder, unspecified: Secondary | ICD-10-CM

## 2023-02-15 DIAGNOSIS — F411 Generalized anxiety disorder: Secondary | ICD-10-CM

## 2023-02-15 MED ORDER — CLONAZEPAM 1 MG PO TABS: 1.0000 mg | ORAL_TABLET | Freq: Every day | ORAL | 1 refills | Status: DC | PRN

## 2023-02-15 NOTE — Patient Instructions (Signed)
Continue venlafaxine 225 mg daily Continue quetiapine 100 mg at night   Decrease clonazepam 1 mg daily as needed for anxiety Next appointment -  6/12 at 2 pm

## 2023-04-07 ENCOUNTER — Other Ambulatory Visit: Payer: Self-pay | Admitting: Psychiatry

## 2023-04-07 DIAGNOSIS — F411 Generalized anxiety disorder: Secondary | ICD-10-CM
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
## 2023-04-12 ENCOUNTER — Telehealth: Payer: Self-pay

## 2023-04-12 ENCOUNTER — Encounter: Payer: Self-pay | Admitting: Psychiatry

## 2023-04-12 ENCOUNTER — Telehealth (INDEPENDENT_AMBULATORY_CARE_PROVIDER_SITE_OTHER): Payer: 59 | Admitting: Psychiatry

## 2023-04-12 DIAGNOSIS — F411 Generalized anxiety disorder: Secondary | ICD-10-CM | POA: Diagnosis not present

## 2023-04-12 DIAGNOSIS — F4310 Post-traumatic stress disorder, unspecified: Secondary | ICD-10-CM | POA: Diagnosis not present

## 2023-04-12 DIAGNOSIS — F3341 Major depressive disorder, recurrent, in partial remission: Secondary | ICD-10-CM

## 2023-04-12 MED ORDER — CLONAZEPAM 1 MG PO TABS: 1.0000 mg | ORAL_TABLET | Freq: Every day | ORAL | 0 refills | Status: DC | PRN

## 2023-04-12 MED ORDER — QUETIAPINE FUMARATE 100 MG PO TABS: 100.0000 mg | ORAL_TABLET | Freq: Every day | ORAL | 2 refills | Status: DC

## 2023-04-12 NOTE — Telephone Encounter (Signed)
faxed [employer] letter that the patient has alternative pharmacy benefits.

## 2023-04-12 NOTE — Telephone Encounter (Signed)
received fax stated that they were unable to further process this request. the member has alternative pharmacy benefits.

## 2023-04-12 NOTE — Telephone Encounter (Signed)
received fax that a prior auth was needed for the quetiapine °

## 2023-04-12 NOTE — Telephone Encounter (Signed)
went online to [email or web address] and submitted the prior auth- pnding review.

## 2023-05-06 IMAGING — DX DG ANKLE COMPLETE 3+V*R*
3 series · 3 of 3 positions shown · non-contrast
Comparison: None Available.

CLINICAL DATA: Status post fall.

EXAM:
RIGHT ANKLE - COMPLETE 3+ VIEW

[ankle ap]
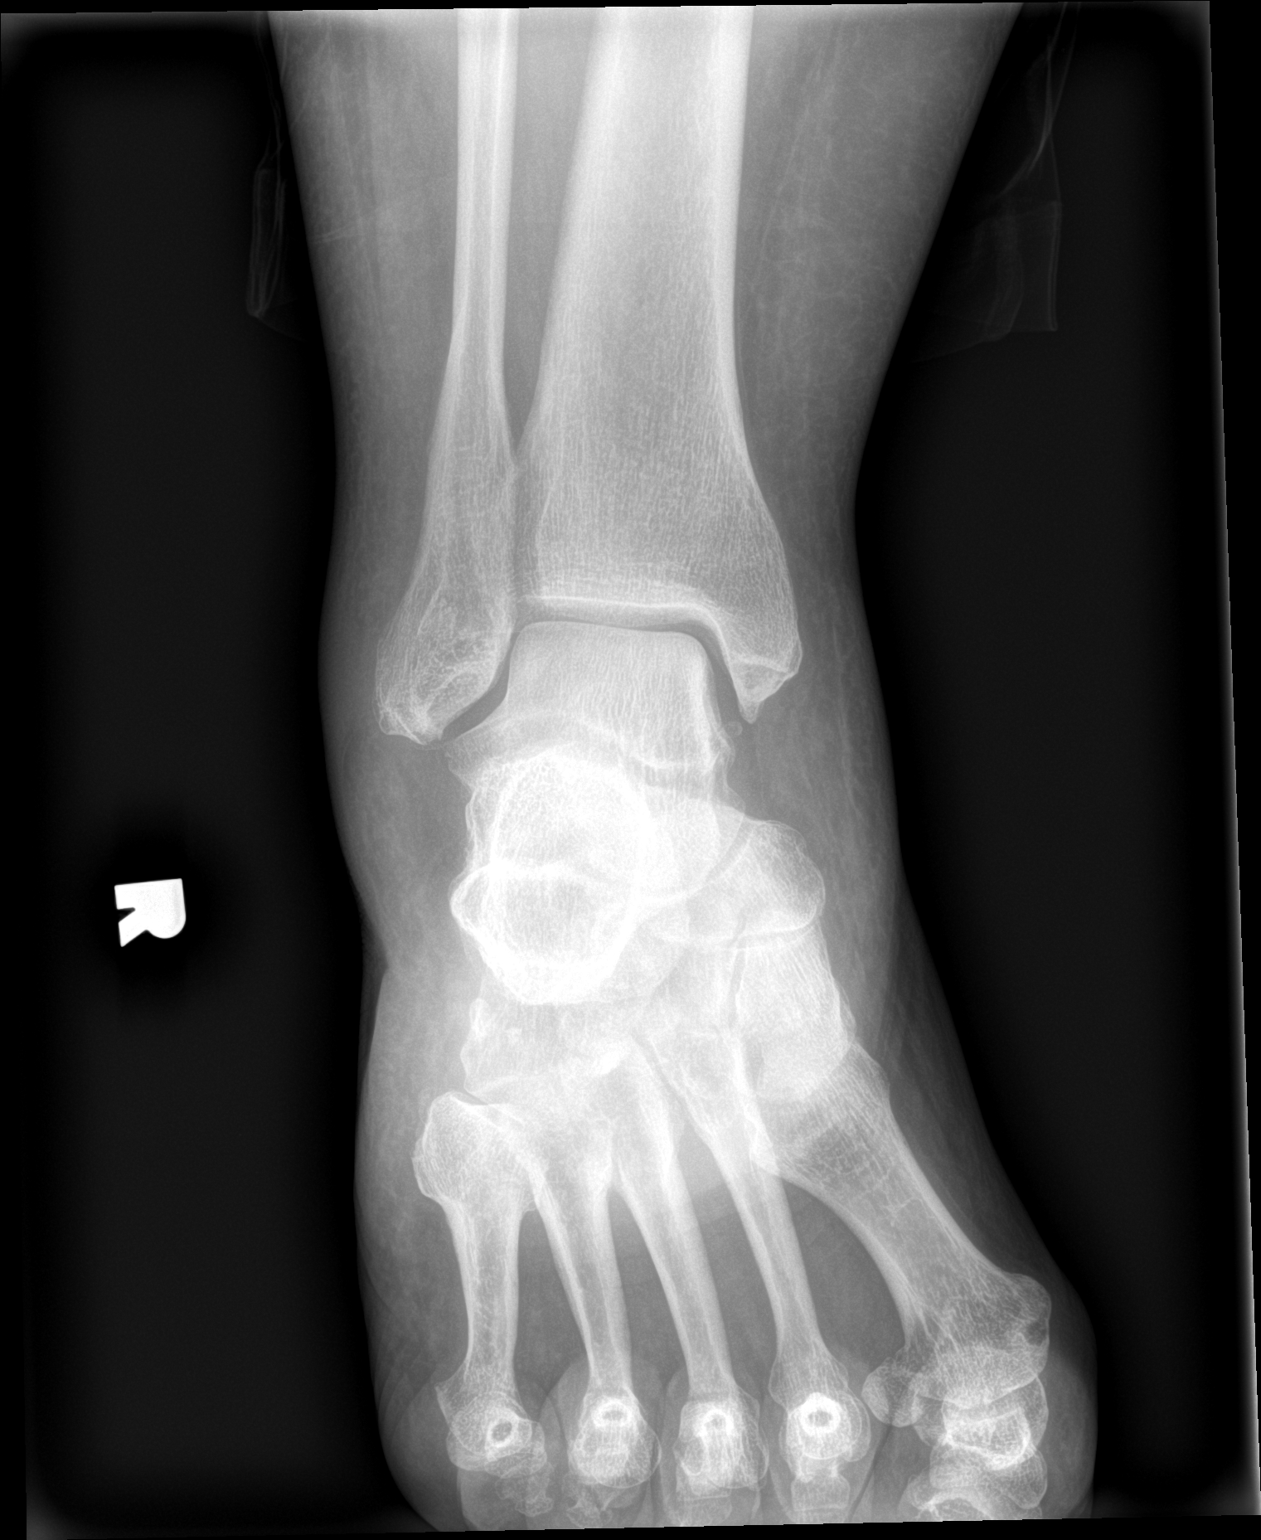

[ankle obl]
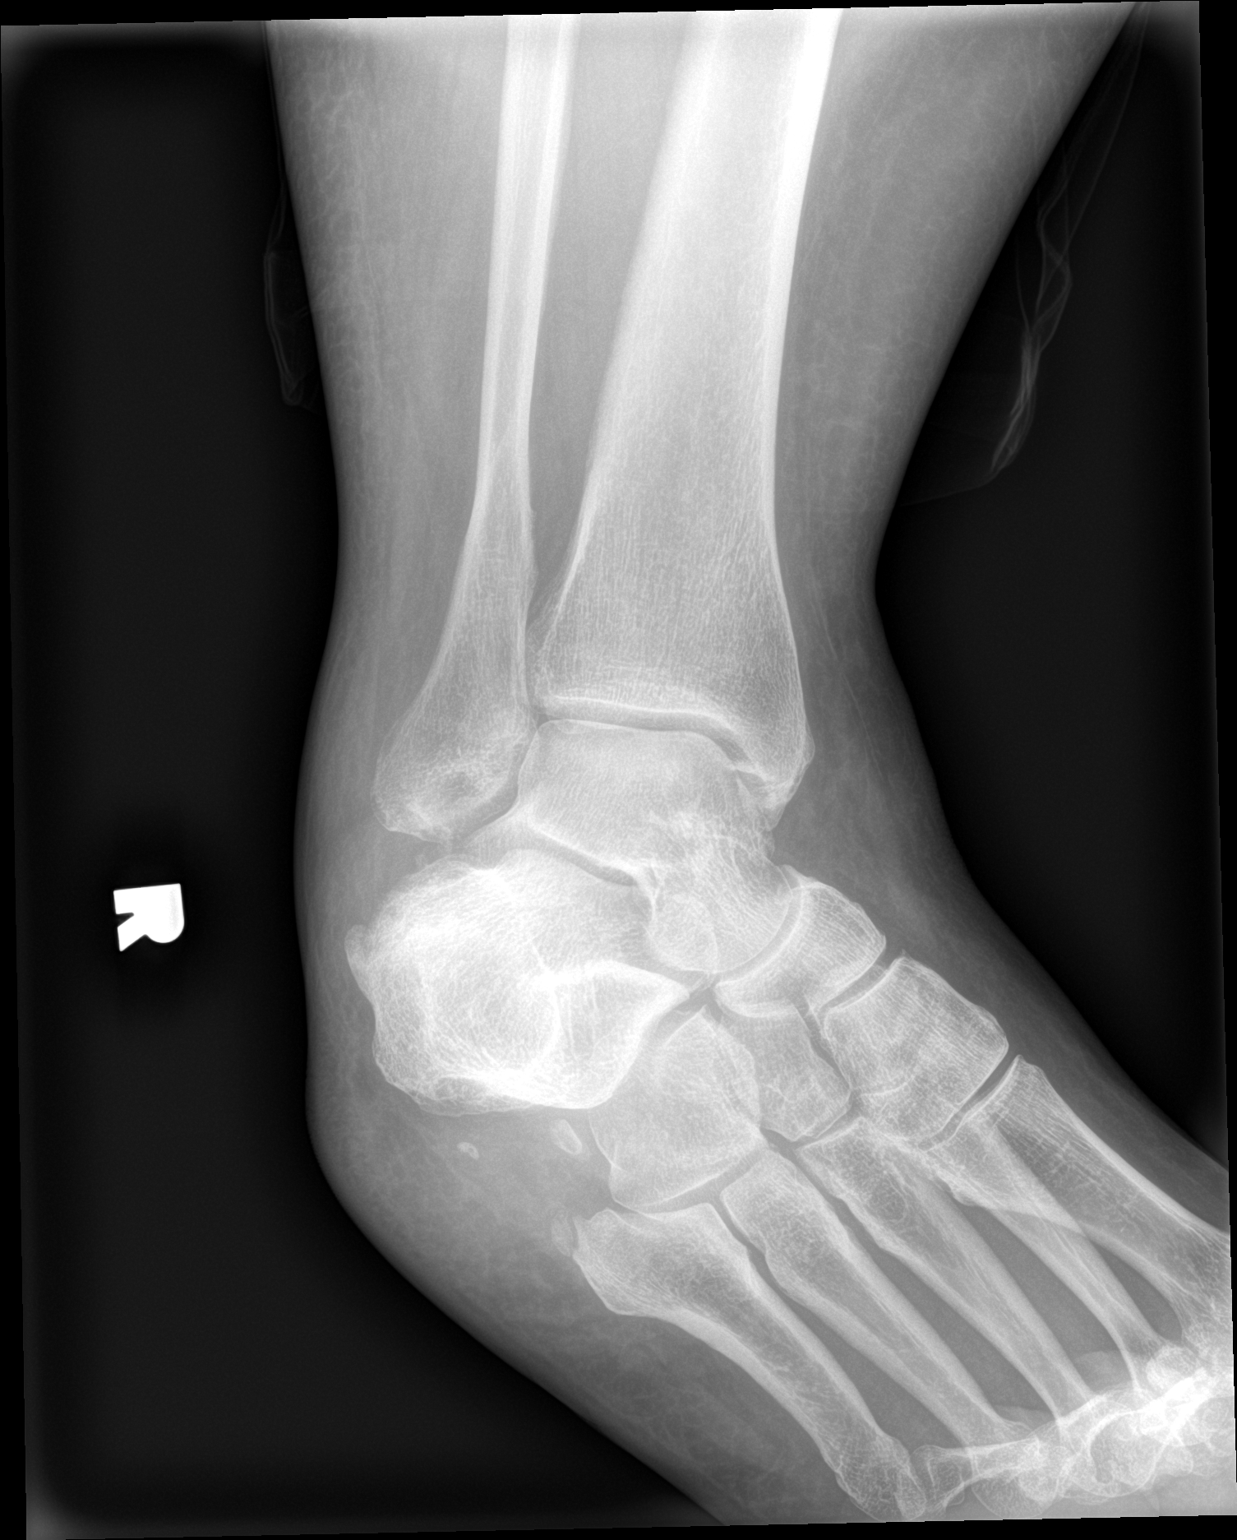

[ankle lat]
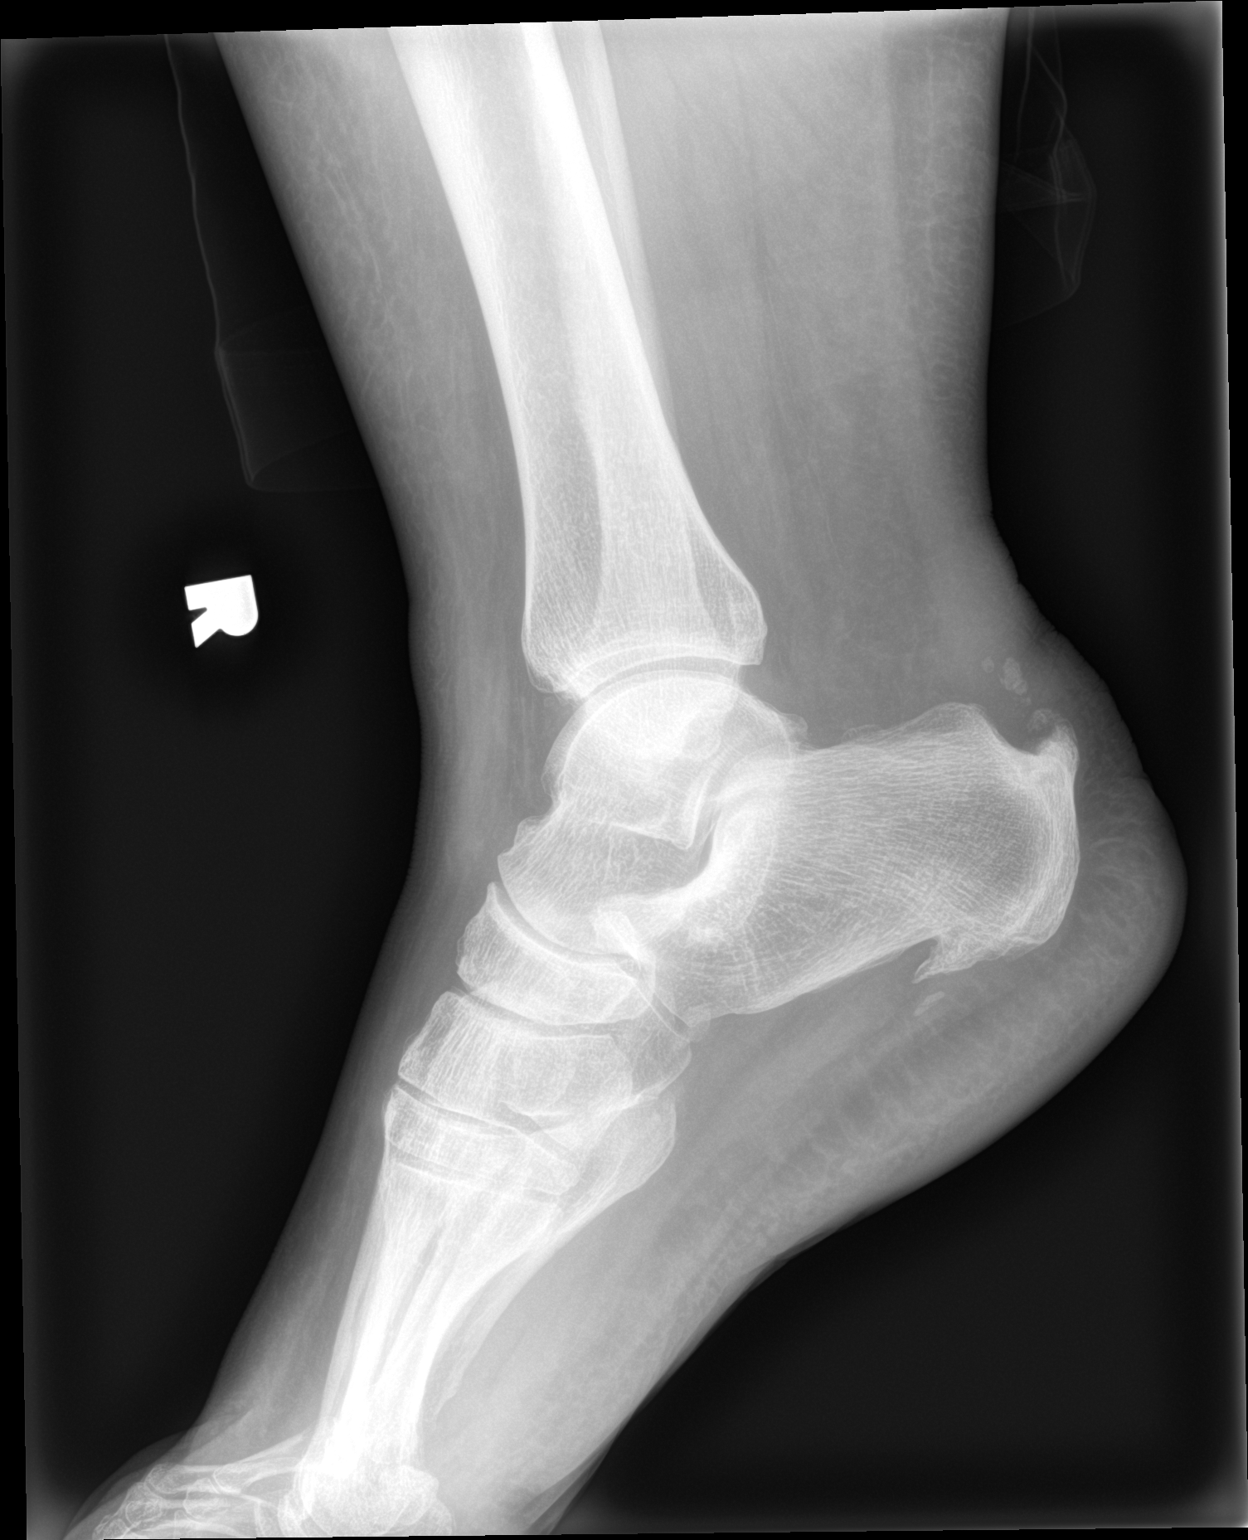

[3 of 3 positions shown; findings below may reference images not displayed]

FINDINGS: There is no evidence of fracture, dislocation, or joint effusion. A
large plantar calcaneal spur is seen. There is no evidence of
arthropathy. There is moderate severity diffuse soft tissue
swelling.
IMPRESSION: Moderate severity diffuse soft tissue swelling without an acute
osseous abnormality.

## 2023-06-07 ENCOUNTER — Other Ambulatory Visit: Payer: Self-pay | Admitting: Psychiatry

## 2023-06-07 DIAGNOSIS — F411 Generalized anxiety disorder: Secondary | ICD-10-CM

## 2023-06-07 NOTE — Telephone Encounter (Signed)
The refill has been ordered as requested. Please contact the patient to schedule a follow-up visit.

## 2023-06-08 ENCOUNTER — Telehealth: Payer: Self-pay

## 2023-06-08 NOTE — Telephone Encounter (Signed)
Scheduled for virtual for September.

## 2023-06-08 NOTE — Telephone Encounter (Signed)
PA submitted online with CoverMyMeds an sent to the patients insurance [employer] for approval of patients Quetiapine Fumarate 100 mg tablets 30 tablets for 30 days and pending review and decision
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
## 2023-07-11 ENCOUNTER — Encounter: Payer: Self-pay | Admitting: Psychiatry

## 2023-07-11 ENCOUNTER — Telehealth (INDEPENDENT_AMBULATORY_CARE_PROVIDER_SITE_OTHER): Payer: 59 | Admitting: Psychiatry

## 2023-07-11 DIAGNOSIS — F411 Generalized anxiety disorder: Secondary | ICD-10-CM

## 2023-07-11 DIAGNOSIS — F330 Major depressive disorder, recurrent, mild: Secondary | ICD-10-CM | POA: Diagnosis not present

## 2023-07-11 DIAGNOSIS — Z79899 Other long term (current) drug therapy: Secondary | ICD-10-CM | POA: Diagnosis not present

## 2023-07-11 DIAGNOSIS — F4310 Post-traumatic stress disorder, unspecified: Secondary | ICD-10-CM | POA: Diagnosis not present

## 2023-07-11 MED ORDER — CLONAZEPAM 1 MG PO TABS: 1.0000 mg | ORAL_TABLET | Freq: Every day | ORAL | 2 refills | Status: AC | PRN

## 2023-07-11 MED ORDER — QUETIAPINE FUMARATE 100 MG PO TABS: 100.0000 mg | ORAL_TABLET | Freq: Every day | ORAL | 2 refills | Status: DC

## 2023-07-11 MED ORDER — VENLAFAXINE HCL ER 75 MG PO CP24: 225.0000 mg | ORAL_CAPSULE | Freq: Every day | ORAL | 1 refills | Status: DC

## 2023-07-11 NOTE — Patient Instructions (Signed)
Continue venlafaxine 225 mg daily Continue quetiapine 100 mg at night   Continue clonazepam 1 mg daily as needed for anxiety Next appointment -11/11 at 1 pm

## 2023-08-10 ENCOUNTER — Telehealth: Payer: Self-pay

## 2023-08-10 NOTE — Telephone Encounter (Signed)
went online to [email or web address] and submitted the prior auth- pending  ID Code for [email or web address] is [email or web address]

## 2023-08-10 NOTE — Telephone Encounter (Signed)
received fax that a prior auth was needed for the quetiapine fumarate 100mg 
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
## 2023-09-11 ENCOUNTER — Telehealth (INDEPENDENT_AMBULATORY_CARE_PROVIDER_SITE_OTHER): Payer: 59 | Admitting: Psychiatry

## 2023-09-11 ENCOUNTER — Encounter: Payer: Self-pay | Admitting: Psychiatry

## 2023-09-11 DIAGNOSIS — F4310 Post-traumatic stress disorder, unspecified: Secondary | ICD-10-CM

## 2023-09-11 DIAGNOSIS — Z79899 Other long term (current) drug therapy: Secondary | ICD-10-CM

## 2023-09-11 DIAGNOSIS — F411 Generalized anxiety disorder: Secondary | ICD-10-CM | POA: Diagnosis not present

## 2023-09-11 DIAGNOSIS — F330 Major depressive disorder, recurrent, mild: Secondary | ICD-10-CM

## 2023-09-11 MED ORDER — VENLAFAXINE HCL ER 75 MG PO CP24: 225.0000 mg | ORAL_CAPSULE | Freq: Every day | ORAL | 5 refills | Status: AC

## 2023-09-11 MED ORDER — QUETIAPINE FUMARATE 100 MG PO TABS: 100.0000 mg | ORAL_TABLET | Freq: Every day | ORAL | 0 refills | Status: AC

## 2023-09-11 NOTE — Patient Instructions (Signed)
Continue venlafaxine 225 mg daily Continue quetiapine 100 mg at night   Continue clonazepam 1 mg daily as needed for anxiety Next appointment -1/7 at 4 pm

## 2023-09-20 ENCOUNTER — Telehealth: Payer: Self-pay

## 2023-09-20 NOTE — Telephone Encounter (Signed)
Prior Auth submitted via CoverMyMeds for Quetiapine Fumarate 100mg  tablets received message   Your PA has been resolved, no additional PA is required. For further inquiries please contact the number on the back of the member prescription card. (Message [phone])  Called patient to make aware of message

## 2023-11-04 NOTE — Progress Notes (Signed)
 No show

## 2023-11-07 ENCOUNTER — Ambulatory Visit (INDEPENDENT_AMBULATORY_CARE_PROVIDER_SITE_OTHER): Payer: 59 | Admitting: Psychiatry

## 2023-11-07 DIAGNOSIS — Z91199 Patient's noncompliance with other medical treatment and regimen due to unspecified reason: Secondary | ICD-10-CM

## 2023-12-19 ENCOUNTER — Other Ambulatory Visit: Payer: Self-pay | Admitting: Psychiatry

## 2023-12-19 DIAGNOSIS — F411 Generalized anxiety disorder: Secondary | ICD-10-CM

## 2024-03-07 NOTE — Progress Notes (Unsigned)
 No show

## 2024-03-11 ENCOUNTER — Ambulatory Visit (INDEPENDENT_AMBULATORY_CARE_PROVIDER_SITE_OTHER): Admitting: Psychiatry

## 2024-03-11 DIAGNOSIS — Z91199 Patient's noncompliance with other medical treatment and regimen due to unspecified reason: Secondary | ICD-10-CM

## 2024-06-15 ENCOUNTER — Other Ambulatory Visit: Payer: Self-pay | Admitting: Psychiatry
